# Patient Record
Sex: Female | Born: 1960
Health system: Southern US, Community
[De-identification: ages and names within clinical notes are randomized; demographics above are authoritative.]

## PROBLEM LIST (undated history)

## (undated) DIAGNOSIS — Z1379 Encounter for other screening for genetic and chromosomal anomalies: Secondary | ICD-10-CM

## (undated) DIAGNOSIS — I2699 Other pulmonary embolism without acute cor pulmonale: Secondary | ICD-10-CM

## (undated) DIAGNOSIS — Z809 Family history of malignant neoplasm, unspecified: Secondary | ICD-10-CM

## (undated) DIAGNOSIS — N809 Endometriosis, unspecified: Secondary | ICD-10-CM

## (undated) DIAGNOSIS — D259 Leiomyoma of uterus, unspecified: Secondary | ICD-10-CM

## (undated) DIAGNOSIS — L120 Bullous pemphigoid: Secondary | ICD-10-CM

## (undated) DIAGNOSIS — I82409 Acute embolism and thrombosis of unspecified deep veins of unspecified lower extremity: Secondary | ICD-10-CM

## (undated) DIAGNOSIS — I82412 Acute embolism and thrombosis of left femoral vein: Secondary | ICD-10-CM

## (undated) HISTORY — PX: ROTATOR CUFF REPAIR: SHX139

## (undated) HISTORY — DX: Acute embolism and thrombosis of left femoral vein: I82.412

## (undated) HISTORY — PX: COLONOSCOPY: SHX174

## (undated) HISTORY — DX: Acute embolism and thrombosis of unspecified deep veins of unspecified lower extremity: I82.409

## (undated) HISTORY — DX: Other pulmonary embolism without acute cor pulmonale: I26.99

## (undated) HISTORY — DX: Endometriosis, unspecified: N80.9

## (undated) HISTORY — DX: Encounter for other screening for genetic and chromosomal anomalies: Z13.79

## (undated) HISTORY — DX: Leiomyoma of uterus, unspecified: D25.9

## (undated) HISTORY — PX: TUBAL LIGATION: SHX77

## (undated) HISTORY — DX: Family history of malignant neoplasm, unspecified: Z80.9

## (undated) HISTORY — PX: THUMB ARTHROSCOPY: SHX2509

---

## 2005-07-25 HISTORY — PX: LAPAROSCOPIC SUPRACERVICAL HYSTERECTOMY: SUR797

## 2005-11-22 ENCOUNTER — Other Ambulatory Visit: Admission: RE | Admit: 2005-11-22 | Discharge: 2005-11-22 | Payer: Self-pay | Admitting: Gynecology

## 2006-05-02 ENCOUNTER — Ambulatory Visit (HOSPITAL_COMMUNITY): Admission: RE | Admit: 2006-05-02 | Discharge: 2006-05-03 | Payer: Self-pay | Admitting: Gynecology

## 2006-05-02 ENCOUNTER — Encounter (INDEPENDENT_AMBULATORY_CARE_PROVIDER_SITE_OTHER): Payer: Self-pay | Admitting: *Deleted

## 2006-07-25 DIAGNOSIS — N809 Endometriosis, unspecified: Secondary | ICD-10-CM

## 2006-07-25 DIAGNOSIS — N80129 Deep endometriosis of ovary, unspecified ovary: Secondary | ICD-10-CM

## 2006-07-25 HISTORY — DX: Deep endometriosis of ovary, unspecified ovary: N80.129

## 2006-07-25 HISTORY — DX: Endometriosis, unspecified: N80.9

## 2007-05-08 ENCOUNTER — Other Ambulatory Visit: Admission: RE | Admit: 2007-05-08 | Discharge: 2007-05-08 | Payer: Self-pay | Admitting: Gynecology

## 2007-05-11 ENCOUNTER — Encounter: Admission: RE | Admit: 2007-05-11 | Discharge: 2007-05-11 | Payer: Self-pay | Admitting: Gynecology

## 2007-05-25 ENCOUNTER — Encounter: Admission: RE | Admit: 2007-05-25 | Discharge: 2007-05-25 | Payer: Self-pay | Admitting: Gynecology

## 2008-11-11 ENCOUNTER — Other Ambulatory Visit: Admission: RE | Admit: 2008-11-11 | Discharge: 2008-11-11 | Payer: Self-pay | Admitting: Gynecology

## 2008-11-11 ENCOUNTER — Ambulatory Visit: Payer: Self-pay | Admitting: Gynecology

## 2008-11-11 ENCOUNTER — Encounter: Payer: Self-pay | Admitting: Gynecology

## 2008-11-13 ENCOUNTER — Encounter: Admission: RE | Admit: 2008-11-13 | Discharge: 2008-11-13 | Payer: Self-pay | Admitting: Gynecology

## 2010-04-08 ENCOUNTER — Ambulatory Visit: Payer: Self-pay | Admitting: Gynecology

## 2010-04-08 ENCOUNTER — Other Ambulatory Visit: Admission: RE | Admit: 2010-04-08 | Discharge: 2010-04-08 | Payer: Self-pay | Admitting: Gynecology

## 2010-12-10 NOTE — Op Note (Signed)
NAMECONNOR, FOXWORTHY               ACCOUNT NO.:  [id number]   MEDICAL RECORD NO.:  [id number]          PATIENT TYPE:  OIB   LOCATION:  9310                          FACILITY:  [facility]   PHYSICIAN:  Timothy P. Fontaine, M.D.DATE OF BIRTH:  November 13, 1960   DATE OF PROCEDURE:  05/02/2006  DATE OF DISCHARGE:                                 OPERATIVE REPORT   PREOPERATIVE DIAGNOSES:  Menorrhagia, dysmenorrhea, leiomyomata.   POSTOPERATIVE DIAGNOSES:  Menorrhagia, dysmenorrhea, leiomyomata, rule out  endometriosis, posterior cul-de-sac adhesions.   PROCEDURE:  Laparoscopic supracervical hysterectomy and lysis of adhesions.   SURGEON:  Timothy P. Fontaine, M.D.   ASSISTANT:  Rande Brunt. Eda Paschal, M.D.   ANESTHETIC:  General endotracheal.   ESTIMATED BLOOD LOSS:  Approximately 200 mL.   COMPLICATIONS:  None.   SPECIMEN:  Uterus morcellated.   FINDINGS:  Uterus enlarged with a large myoma, right and left fallopian  tubes grossly normal, evidence of prior tubal sterilization.  Right and left  ovaries grossly normal, free and mobile.  Anterior cul-de-sac grossly  normal, posterior cul-de-sac with filmy adhesions between sigmoid and  posterior peritoneal surface.  Powder burn type lesion posterior surface  consistent with endometriosis removed with specimen.  Upper abdominal exam  grossly normal.   PROCEDURE:  The patient was taken to the operating room, underwent general  anesthesia was placed low dorsal lithotomy position, received abdominal  perineal vaginal preparation with Betadine solution per nursing personnel.  EUA performed.  Bladder emptied with indwelling Foley catheterization placed  in sterile technique.  Cervix was visualized, grasped with single-tooth  tenaculum and the RUMI uterine manipulator was placed, the 8 cm probe used.  The patient was then draped in usual fashion.  A transverse infraumbilical  incision was made.  Using the OptiVu type direct entry system the abdomen  was directly entered under laparoscopic visualization, 10 mm port without  difficulty and subsequently insufflated.  Right and left suprapubic 5-mm  ports were then placed under direct visualization after transillumination  for the vessels without difficulty.  Examination of pelvic organs, upper  abdominal exam was then carried out with findings noted above.  Attempted  initial manipulation revealed difficulty in manipulating the uterus due to  the uterine size and a large myoma and subsequently a 5 mm midline  suprapubic port was placed to aid in manipulation.  Using the single tooth 5  mm laparoscopic manipulator and the intrauterine RUMI manipulator, the  uterus was adequately able to be manipulated.  The right round ligament was  identified, bipolar cauterized using the Gyrus tripolar incisor and sharply  incised.  The right uterine ovarian pedicle was tripolar cauterized x2  passes and sharply incised.  The right parametrial tissues were then sharply  incised after bipolar cauterization without difficulty and the anterior  vesicouterine peritoneal fold was then sharply incised with the initial  development of the bladder flap.  A similar procedure was then carried out  on the other side with meeting in the middle of the anterior cul-de-sac  peritoneal incision.  The left uterine vessels were then skeletonized and  identified and were  bipolar cauterized x3 passes and subsequently sharply  incised.  A similar procedure was carried out on the other side.  Using the  gyrus spatula, the cervix was then transected to the level of the RUMI  manipulator which ultimately was then removed and the uterus was then  amputated from the cervical stump.  A few bleeding sites were then bipolar  cauterized with the tripolar.  The left 5-mm suprapubic port was then  removed.  The skin incisions slightly enlarged and the morcellating trocar  was then placed under direct visualization into the peritoneal  cavity  without difficulty.  The uterus was then progressively morcellated and  ultimately removed in its entirety through multiple passes.  The weight of  the specimen was determined at 450 grams.  Attention was directed to the  abdomen and cul-de-sac to meticulously remove all fragments.  The pelvis was  copiously irrigated and again any remaining fragments were removed.  Pelvis  was inspected, all pedicle sites inspected and adequate hemostasis was  visualized and the endocervical canal was entered with the bipolar  instrument and the cervical stump was cauterized in the endocervical canal.  Using the flat portion of the gyrus spatula the canal was also cauterized in  a circumferential manner.  Intercede was then placed over the cervical area,  the 5 mm ports were then removed and using the gyrus port fascial closing  device the morcellating left suprapubic port was then closed using 0 Vicryl  suture without difficulty.  The gas was slowly allowed to escape.  All cul-  de-sac right and left ovarian pedicles and the anterior port sites were all  visualized under a low-pressure situation showing adequate hemostasis and  the infraumbilical port was then backed out under direct visualization  showing no evidence of hernia formation.  Adequate hemostasis.  A 0 Vicryl  interrupted subcutaneous fascial stitch was placed infraumbilically and all  skin incisions were closed using Dermabond skin adhesive.  The patient was  placed in supine position, awakened without difficulty and was taken to the  recovery room in good condition having tolerated procedure well having free-  flowing clear yellow urine noted.      Timothy P. Fontaine, M.D.  Electronically Signed     TPF/MEDQ  D:  05/02/2006  T:  05/04/2006  Job:  [id number]

## 2010-12-10 NOTE — Discharge Summary (Signed)
[facility], [facility]               ACCOUNT NO.:  [id number]   MEDICAL RECORD NO.:  [id number]          PATIENT TYPE:  OIB   LOCATION:  9310                          FACILITY:  [facility]   PHYSICIAN:  Timothy P. Fontaine, M.D.DATE OF BIRTH:  March 03, 1961   DATE OF ADMISSION:  05/02/2006  DATE OF DISCHARGE:  05/03/2006                                 DISCHARGE SUMMARY   DISCHARGE DIAGNOSES:  1. Menorrhagia.  2. Dysmenorrhea.  3. Leiomyomata.   PROCEDURE:  Laparoscopic supracervical hysterectomy with lysis of adhesions  on May 02, 2006, Dr. Colin Broach, pathology pending.   HOSPITAL COURSE:  A 50 year old, G0 with increasing menorrhagia and  dysmenorrhea with leiomyomata underwent uncomplicated laparoscopic  supracervical hysterectomy with lysis of adhesions on May 02, 2006.  The  patient's postoperative course was uncomplicated.  She was discharged on  postop day #1, ambulating well, tolerating a regular diet, voiding without  difficulty with a postoperative hemoglobin of 10.9.  The patient received  precautions, instructions and followup.  She will be seen in the office in 2  weeks following discharge.   DISCHARGE MEDICATIONS:  Received a prescription for Darvocet-N 100, #20, one  to two p.o. q.4-6 h. p.r.n. pain.      Timothy P. Fontaine, M.D.  Electronically Signed     TPF/MEDQ  D:  05/03/2006  T:  05/04/2006  Job:  [id number]

## 2010-12-10 NOTE — H&P (Signed)
Julie Harding, Julie Harding               ACCOUNT NO.:  [id number]   MEDICAL RECORD NO.:  [id number]          PATIENT TYPE:  AMB   LOCATION:  SDC                           [facility]ILITY:  [facility]   PHYSICIAN:  Timothy P. Fontaine, M.D.DATE OF BIRTH:  01-26-1961   DATE OF ADMISSION:  DATE OF DISCHARGE:                                HISTORY & PHYSICAL   Does not have a hospital number yet.  She is being admitted to [facility] for surgery October 9, 7:30.   CHIEF COMPLAINT:  Menorrhagia, dysmenorrhea, leiomyomata.   HISTORY OF PRESENT ILLNESS:  A 50 year old G0, status post tubal  sterilization, presents with increasing menorrhagia, dysmenorrhea, lower  abdominal discomfort.  Outpatient evaluation revealed a large 12-week size  uterus with ultrasound confirming a myoma.  The patient is admitted at this  time for attempt laparoscopic supracervical hysterectomy, possible total  abdominal hysterectomy, bilateral salpingo-oophorectomy.   PAST MEDICAL HISTORY:  Unremarkable.   PAST SURGICAL HISTORY:  Includes a tubal sterilization, orthopedic repair.   CURRENT MEDICATIONS:  None.   ALLERGIES:  VICODIN.   REVIEW OF SYSTEMS:  Noncontributory.   FAMILY HISTORY:  Noncontributory.   SOCIAL HISTORY:  Noncontributory.   ADMISSION PHYSICAL EXAM:  Afebrile.  Vital signs are stable.  HEENT: Normal.  LUNGS: Clear.  CARDIAC:  Regular rate without rubs, murmurs or gallops.  ABDOMEN:  Exam benign with palpable uterus above the symphysis.  PELVIC:  External BUS, vagina normal.  Cervix normal.  Uterus 12 weeks size,  bulky, midline, mobile.  Adnexa without gross masses or tenderness.   ASSESSMENT/PLAN:  A 50 year old G0, status post tubal sterilization,  enlarged uterus with leiomyomata, increasing menorrhagia, dysmenorrhea,  pelvic pressure, discomfort for laparoscopic supracervical hysterectomy,  possible total abdominal hysterectomy.  I reviewed the situation with the  patient and options to  include conservative nonsurgical, as well as surgical  intervention.  Options for surgical intervention to include total abdominal  hysterectomy, laparoscopic total hysterectomy, laparoscopic supracervical  hysterectomy were all reviewed with her, and she is comfortable with the  laparoscopic supracervical approach.  The patient understands that she may  continue to have some bleeding following the procedure if endometrial glands  are found within the cervical stump, and she understands and accepts this  possibility.  Long-term management issues up to and including trachelectomy  was also reviewed with her, and she understands and accepts this  possibility.  The patient also understands, due to the bulk of her uterus,  as well as on history, she is  noted during the time of her tubal the doctor  had noted that there were some adhesions, that if indeed that we are unable  to proceed with the laparoscopic approach.  I feel that it is unsafe to  proceed with this, that we would convert to an abdominal hysterectomy and at  that point would remove her cervix, and she understands and accepts that.  Ovarian conservation issue was reviewed with her, and the options of keeping  both of her ovaries versus having them removed was reviewed.  The issues of  continued hormone production versus  problems with her ovaries in the future  requiring re-operation or ovarian cancer in the future was reviewed, and she  prefers to keep both ovaries, accepting the risk of ovarian cancer or  problems with her ovaries in the future, although she does give me  permission to remove one or both ovaries, if significant pathology is  encountered at the time of surgery.  Sexuality following hysterectomy was  also reviewed.  The potential for orgasmic dysfunction and persistent  dyspareunia following the procedure was discussed, understood and accepted.  The patient clearly understands hysterectomy is absolute irreversible   sterility, and she understands and accepts this also.  The acute  intraoperative postoperative risks were reviewed to include the risk of  infection requiring prolonged antibiotics, abscess formation or hematoma  formation requiring reoperation, drainage of abscess or hematomas, the risk  of hemorrhage necessitating transfusion and risks of transfusion including  transfusion reaction, hepatitis, HIV, mad cow disease and other unknown  entities.  The risks of wound complications, either from the smaller  laparoscopic incisions or larger abdominal hysterectomy incisions were  discussed, and the potential for opening and draining of incisions closure  by secondary intention was discussed, understood and accepted.  The risk of  an inadvertent injury to internal organs including bowel, bladder, ureters,  vessels and nerves necessitating major exploratory reparative surgeries and  future reparative surgeries including ostomy formation, either immediately  recognized or delay recognized was discussed, understood and accepted.  Again, if we achieve the laparoscopic supracervical approach, she  understands that the continued Pap smear surveillance is required and the  potential for persistent bleeding following the procedure is possible, and  she understands and accepts this.  The patient's questions were answered to  her satisfaction, and she is ready to proceed with surgery.      Timothy P. Fontaine, M.D.  Electronically Signed     TPF/MEDQ  D:  04/19/2006  T:  04/20/2006  Job:  [id number]

## 2013-07-25 DIAGNOSIS — I82409 Acute embolism and thrombosis of unspecified deep veins of unspecified lower extremity: Secondary | ICD-10-CM

## 2013-07-25 HISTORY — DX: Acute embolism and thrombosis of unspecified deep veins of unspecified lower extremity: I82.409

## 2014-02-13 ENCOUNTER — Ambulatory Visit: Payer: Self-pay | Admitting: Podiatry

## 2014-02-27 ENCOUNTER — Encounter: Payer: Self-pay | Admitting: Podiatry

## 2014-02-27 ENCOUNTER — Ambulatory Visit (INDEPENDENT_AMBULATORY_CARE_PROVIDER_SITE_OTHER): Payer: 59 | Admitting: Podiatry

## 2014-02-27 VITALS — BP 104/59 | HR 53 | Resp 16 | Ht 62.0 in | Wt 123.0 lb

## 2014-02-27 DIAGNOSIS — L600 Ingrowing nail: Secondary | ICD-10-CM

## 2014-02-27 NOTE — Progress Notes (Signed)
Subjective:     Patient ID: Julie Harding, female   DOB: Aug 24, 1960, 53 y.o.   MRN: [id number]  HPI patient states that her right second nail has been giving her trouble for at least 5 years not growing right and becoming increasingly discomforting. States she runs approximately 50 miles a week   Review of Systems  All other systems reviewed and are negative.      Objective:   Physical Exam  Nursing note and vitals reviewed. Constitutional: She is oriented to person, place, and time.  Cardiovascular: Intact distal pulses.   Musculoskeletal: Normal range of motion.  Neurological: She is oriented to person, place, and time.  Skin: Skin is warm.   neurovascular status intact with muscle strength adequate and range of motion of the subtalar and midtarsal joint within normal limits. Patient is found to have a damaged second nail right that is growing abnormally and is painful when I palpated from a dorsal direction. Patient's digits are well-perfused and no other pathology was noted     Assessment:     Damaged right second nail secondary to excessive activity and probable initial trauma    Plan:     H&P and condition reviewed with patient. I've recommended removal and a permanent way for this nailbed and explained that it would not regrow. Patient wants surgery and today I infiltrated 60 mg Xylocaine Marcaine mixture applied sterile prep to the area and then removed the nail completely. I exposed matrix and applied phenol 3 applications 30 seconds followed by alcohol lavaged and sterile dressing. Instructed on soaks and reappoint

## 2014-02-27 NOTE — Patient Instructions (Signed)

## 2014-02-27 NOTE — Progress Notes (Signed)
   Subjective:    Patient ID: Julie Harding, female    DOB: [birth year], 53 y.o.   MRN: [id number]  HPI Comments: "I have this bad toe"  Patient c/o tender 2nd toenail for several months, off and on. The nail was initially dark and fell off. Now, the nail is thick and loose from the nail bed. She is an active runner. She keeps the nail trimmed down.     Review of Systems  Cardiovascular:       Calf pain with walking   Gastrointestinal: Positive for blood in stool.  All other systems reviewed and are negative.      Objective:   Physical Exam        Assessment & Plan:

## 2014-03-07 ENCOUNTER — Telehealth: Payer: Self-pay | Admitting: *Deleted

## 2014-03-07 NOTE — Telephone Encounter (Signed)
Per Dr. Paulla Dolly, I called and informed her to stop the Betadine soaks.  Start using Epsom salt soaks instead.  If it's not better by Monday, call and schedule an appointment with Dr. Paulla Dolly.  Call if you have any further questions are concerns.

## 2014-03-07 NOTE — Telephone Encounter (Signed)
I saw him a week ago yesterday.  He removed a toenail on my second toe.  It is still really sore excruciatingly and my toe and entire foot is swollen.  Still doing Betadine soaks and following all his instructions.  Should this be like this?  Please give me a call to put my fear to rest.

## 2014-03-10 ENCOUNTER — Telehealth: Payer: Self-pay | Admitting: *Deleted

## 2014-03-10 NOTE — Telephone Encounter (Signed)
He removed a toenail Friday a week ago.  I called Friday and you called me back.  I been soaking over the weekend in [facility], the swelling went down but it's still ugly and it's still painful.  Is that normal.  I can snap a picture and email it to you so you can let me know if it is normal.  Please call me back.

## 2014-03-12 NOTE — Telephone Encounter (Signed)
I attempted to return her call.  I left a message for her to call me tomorrow.

## 2014-03-19 NOTE — Telephone Encounter (Signed)
I attempted to call the patient again.  I left her a message stating I was just following up from last week.  I hope everything is going well.  Please give [facility] a call if you have any further questions or concerns.

## 2016-06-08 ENCOUNTER — Other Ambulatory Visit: Payer: Self-pay | Admitting: Cardiology

## 2016-06-08 DIAGNOSIS — R0602 Shortness of breath: Secondary | ICD-10-CM

## 2016-06-08 DIAGNOSIS — Z86718 Personal history of other venous thrombosis and embolism: Secondary | ICD-10-CM

## 2016-06-20 ENCOUNTER — Encounter (HOSPITAL_COMMUNITY)
Admission: RE | Admit: 2016-06-20 | Discharge: 2016-06-20 | Disposition: A | Discharge status: Home / Self Care | Payer: Commercial Managed Care - HMO | Source: Ambulatory Visit | Attending: Cardiology | Admitting: Cardiology

## 2016-06-20 ENCOUNTER — Encounter (HOSPITAL_COMMUNITY): Payer: Self-pay

## 2016-06-20 ENCOUNTER — Ambulatory Visit (HOSPITAL_COMMUNITY)
Admission: RE | Admit: 2016-06-20 | Discharge: 2016-06-20 | Disposition: A | Discharge status: Home / Self Care | Payer: Commercial Managed Care - HMO | Source: Ambulatory Visit | Attending: Cardiology | Admitting: Cardiology

## 2016-06-20 DIAGNOSIS — R0602 Shortness of breath: Secondary | ICD-10-CM | POA: Insufficient documentation

## 2016-06-20 DIAGNOSIS — R938 Abnormal findings on diagnostic imaging of other specified body structures: Secondary | ICD-10-CM | POA: Insufficient documentation

## 2016-06-20 DIAGNOSIS — Z86718 Personal history of other venous thrombosis and embolism: Secondary | ICD-10-CM

## 2016-06-20 MED ORDER — TECHNETIUM TO 99M ALBUMIN AGGREGATED
4.0000 | Freq: Once | INTRAVENOUS | Status: AC | PRN
  Administered 2016-06-20: 4 via INTRAVENOUS

## 2016-06-20 MED ORDER — TECHNETIUM TC 99M DIETHYLENETRIAME-PENTAACETIC ACID: 30.0000 | Freq: Once | INTRAVENOUS | Status: DC | PRN

## 2016-07-27 ENCOUNTER — Other Ambulatory Visit: Payer: Self-pay | Admitting: Gynecology

## 2016-07-27 ENCOUNTER — Encounter: Payer: Self-pay | Admitting: Gynecology

## 2016-07-27 ENCOUNTER — Ambulatory Visit (INDEPENDENT_AMBULATORY_CARE_PROVIDER_SITE_OTHER): Payer: Commercial Managed Care - HMO | Admitting: Gynecology

## 2016-07-27 VITALS — BP 120/70 | Ht 63.0 in | Wt 135.0 lb

## 2016-07-27 DIAGNOSIS — Z01419 Encounter for gynecological examination (general) (routine) without abnormal findings: Secondary | ICD-10-CM

## 2016-07-27 DIAGNOSIS — N951 Menopausal and female climacteric states: Secondary | ICD-10-CM

## 2016-07-27 DIAGNOSIS — I824Y9 Acute embolism and thrombosis of unspecified deep veins of unspecified proximal lower extremity: Secondary | ICD-10-CM

## 2016-07-27 DIAGNOSIS — Z1231 Encounter for screening mammogram for malignant neoplasm of breast: Secondary | ICD-10-CM

## 2016-07-27 LAB — TSH: TSH: 2.07 m[IU]/L

## 2016-07-27 NOTE — Addendum Note (Signed)
Addended by: Nelva Nay on: 07/27/2016 12:55 PM   Modules accepted: Orders

## 2016-07-27 NOTE — Progress Notes (Signed)
Jul[facility] Hard[facility] 11-24-1960 [id number]        56 y.o.  G0P0000 for annual exam.  Has not been in the office for 5-6 years. History of laparoscopic supracervical hysterectomy for leiomyoma. Over the past year patient has noticed worsening hot flushes and night sweats. Also noticing weight gain and some depression although there is some situational issues going on in her life.  Past medical history,surgical history, problem list, medications, allergies, family history and social history were all reviewed and documented as reviewed in the EPIC chart.  ROS:  Performed with pertinent positives and negatives included in the history, assessment and plan.   Additional significant findings :  None   Exam: Caryn Bee assistant Vitals:   07/27/16 1144  BP: 120/70  Weight: 135 lb (61.2 kg)  Height: 5\' 3"  (1.6 m)   Body mass index is 23.91 kg/m.  General appearance:  Normal affect, orientation and appearance. Skin: Grossly normal HEENT: Without gross lesions.  No cervical or supraclavicular adenopathy. Thyroid normal.  Lungs:  Clear without wheezing, rales or rhonchi Cardiac: RR, without RMG Abdominal:  Soft, nontender, without masses, guarding, rebound, organomegaly or hernia Breasts:  Examined lying and sitting without masses, retractions, discharge or axillary adenopathy. Pelvic:  Ext, BUS, Vagina normal  Cervix normal. Pap smear done  Adnexa without masses or tenderness    Anus and perineum normal   Rectovaginal normal sphincter tone without palpated masses or tenderness.    Assessment/Plan:  56 y.o. G0P0000 female for annual exam.   1. Menopausal symptoms. Classic menopausal symptoms to include hot flushes, night sweats, not feeling as well, difficulty sleeping, some depression. Has history of DVT several years ago treated with anticoagulants for 3 months. Currently taking a low dose aspirin. Reports being "worked up" and they found nothing as etiology. She's very active running 7  mouth daily. No other family members with clotting issues. Recommended checking Leiden factor V now she is to have that drawn. Also check TSH rule out menopausal cause of her symptoms. Options for management to include OTC products which she has tried and are not working, nonhormonal pharmacologic such as Effexor and HRT. The issues of her DVT and use of HRT from a relative contraindication standpoint also reviewed. Clear risk of precipitating recurrence of the DVT discussed versus quality-of-life symptom relief. At this point the patient would prefer low-dose ERT if possible. Will await Leiden factor screen. If negative then low-dose patch for transdermal delivery to help decrease thrombosis issue reviewed. If positive then will rediscuss nonhormonal options. Patient clearly understands the issues and risks. I also reviewed the NAMS 2017 guidelines with benefits to include symptom relief, cardiovascular and bone health if started early potential reviewed. Breast cancer and thrombosis risks discussed. She does have a supracervical hysterectomy and whether to add progesterone was also reviewed. May initiate with patch with intermittent withdrawal with progesterone to see if she does any spotting intermittently. Will rediscuss after lab results return. 2. Mammography 2010. Patient understands she is clearly overdue and agrees to call and schedule. Names and numbers provided. SBE monthly reviewed. 3. Colonoscopy due this coming year and she is going to follow up with Dr. Sarina Ser for this. 4. Pap smear 2011. Pap smear done today. No history of significant abnormal Pap smears. 5. Health maintenance. No routine labs were done as this was all done through her primary physician's office. Follow up for lab results and further discussion of treatment plan   Anastasio Auerbach MD, 12:41 PM  07/27/2016     

## 2016-07-27 NOTE — Patient Instructions (Signed)
Call to Schedule your mammogram  Facilities in [place]: 1)  [facility]. [facility], [address] Phone: [phone] 2)  Dr. Bertrand at [facility]  [address] Phone: [phone]     Mammogram A mammogram is an X-ray test to find changes in a woman's breast. You should get a mammogram if:  You are 54 years of age or older  You have risk factors.   Your doctor recommends that you have one.  BEFORE THE TEST  Do not schedule the test the week before your period, especially if your breasts are sore during this time.  On the day of your mammogram:  Wash your breasts and armpits well. After washing, do not put on any deodorant or talcum powder on until after your test.   Eat and drink as you usually do.   Take your medicines as usual.   If you are diabetic and take insulin, make sure you:   Eat before coming for your test.   Take your insulin as usual.   If you cannot keep your appointment, call before the appointment to cancel. Schedule another appointment.  TEST  You will need to undress from the waist up. You will put on a hospital gown.   Your breast will be put on the mammogram machine, and it will press firmly on your breast with a piece of plastic called a compression paddle. This will make your breast flatter so that the machine can X-ray all parts of your breast.   Both breasts will be X-rayed. Each breast will be X-rayed from above and from the side. An X-ray might need to be taken again if the picture is not good enough.   The mammogram will last about 15 to 30 minutes.  AFTER THE TEST Finding out the results of your test Ask when your test results will be ready. Make sure you get your test results.  Document Released: 10/07/2008 Document Revised: 06/30/2011 Document Reviewed: 10/07/2008 [employer] Patient Information 2012 [employer].   

## 2016-07-28 DIAGNOSIS — M25551 Pain in right hip: Secondary | ICD-10-CM | POA: Diagnosis not present

## 2016-07-28 LAB — PAP IG W/ RFLX HPV ASCU

## 2016-07-29 ENCOUNTER — Ambulatory Visit
Admission: RE | Admit: 2016-07-29 | Discharge: 2016-07-29 | Disposition: A | Discharge status: Home / Self Care | Payer: Commercial Managed Care - HMO | Source: Ambulatory Visit | Attending: Gynecology | Admitting: Gynecology

## 2016-07-29 DIAGNOSIS — Z1231 Encounter for screening mammogram for malignant neoplasm of breast: Secondary | ICD-10-CM | POA: Diagnosis not present

## 2016-08-01 DIAGNOSIS — R0602 Shortness of breath: Secondary | ICD-10-CM | POA: Diagnosis not present

## 2016-08-01 LAB — FACTOR 5 LEIDEN

## 2016-08-04 ENCOUNTER — Telehealth: Payer: Self-pay | Admitting: *Deleted

## 2016-08-04 ENCOUNTER — Other Ambulatory Visit: Payer: Self-pay | Admitting: Gynecology

## 2016-08-04 MED ORDER — ESTRADIOL 0.05 MG/24HR TD PTTW: 1.0000 | MEDICATED_PATCH | TRANSDERMAL | 8 refills | Status: DC

## 2016-08-04 MED ORDER — PROGESTERONE MICRONIZED 100 MG PO CAPS: ORAL_CAPSULE | ORAL | 2 refills | Status: DC

## 2016-08-04 NOTE — Telephone Encounter (Signed)
PA done online for vivelle dot patch 0.05 mg via cover my med, will wait for response.

## 2016-08-09 MED ORDER — ESTRADIOL 0.5 MG PO TABS: 0.5000 mg | ORAL_TABLET | Freq: Every day | ORAL | 11 refills | Status: DC

## 2016-08-09 NOTE — Telephone Encounter (Signed)
Pt informed with the below note, Rx sent. 

## 2016-08-09 NOTE — Telephone Encounter (Signed)
She can take estradiol 0.5 mg orally #30 refill 11

## 2016-08-09 NOTE — Telephone Encounter (Signed)
Pt insurance company denied the coverage for estradiol 0.05 patches, any other options for medication? Please advise

## 2016-08-18 DIAGNOSIS — M25551 Pain in right hip: Secondary | ICD-10-CM | POA: Diagnosis not present

## 2016-08-25 DIAGNOSIS — I2699 Other pulmonary embolism without acute cor pulmonale: Secondary | ICD-10-CM

## 2016-08-25 HISTORY — DX: Other pulmonary embolism without acute cor pulmonale: I26.99

## 2016-08-29 DIAGNOSIS — M7061 Trochanteric bursitis, right hip: Secondary | ICD-10-CM | POA: Diagnosis not present

## 2016-09-02 ENCOUNTER — Telehealth: Payer: Self-pay | Admitting: *Deleted

## 2016-09-02 NOTE — Telephone Encounter (Signed)
Pt understood all the below, her only last question was she takes progesterone day 1-12 every other month and not monthly. Should she skip March and start Rx again in April?

## 2016-09-02 NOTE — Telephone Encounter (Signed)
#  1 are the night sweats since starting Prometrium or did she have them with the estrogen also? #2 weight gain in their early 37s is a very common problem both for men and women due to changes in metabolism and is a balance between calories in and calories out. Sometimes gaining a little weight is difficult to avoid. A recent thyroid check was normal #3 no to the diet pills. I'm not a believer in these for long-term weight control. They do come with potential significant side effects and have never been shown to be of benefit for long-term weight control.

## 2016-09-02 NOTE — Telephone Encounter (Signed)
Pt takes estradiol 0.5 mg tablet daily and progesterone day 1-12 monthly. Has several issues to relay:  1. Pt started the progesterone on feb 1 and c/o  issues with sleep nightly since taking medication, takes at bedtime, waking up sweating in middle of night. Patient has not had any bleeding while on progesterone.  2. Patient said she feels slightly depressed due to weight gain, pt is a active runner, runs 7 miles daily, when she takes her weight she has gained a pound. I did explained to patient along with age metabolism does slow down and she understood, states she eats very heathy.   3. She asked if you would be willing to prescribed medication weight loss medication?   Please advise

## 2016-09-02 NOTE — Telephone Encounter (Signed)
I would go ahead and stop the progesterone now. If the night sweats continue let me know. Otherwise if they resolve I would try with the progesterone one more time the beginning of next month and see what happens. If the sweats again recur and let me know. Let me know if she does any bleeding.

## 2016-09-02 NOTE — Telephone Encounter (Signed)
Pt said night sweats only started with progesterone, pt became very tearful on phone when relaying the information to her regarding weight. Pt asked what should she do regarding the progesterone and lack of sleep and night sweats? Please advise

## 2016-09-05 NOTE — Telephone Encounter (Signed)
Pt informed with the below note, will skip march and start progesterone in April.

## 2016-09-05 NOTE — Telephone Encounter (Signed)
Okay 

## 2016-09-09 ENCOUNTER — Telehealth: Payer: Self-pay | Admitting: *Deleted

## 2016-09-09 NOTE — Telephone Encounter (Signed)
Pt informed, she will try this and call if she needs a new Rx for this dose

## 2016-09-09 NOTE — Telephone Encounter (Signed)
Try increasing her estradiol to 1-1/2 pill daily which will be 0.75 mg equivalent. We'll see if this doesn't help with the hot flushes

## 2016-09-09 NOTE — Telephone Encounter (Signed)
Pt called to follow up from telephone call on 09/02/16 told to call if her hot flashes and night sweats continue, it has been 1 week and still same symptoms.  Please advise

## 2016-09-19 DIAGNOSIS — R0789 Other chest pain: Secondary | ICD-10-CM | POA: Diagnosis not present

## 2016-09-20 ENCOUNTER — Telehealth: Payer: Self-pay | Admitting: *Deleted

## 2016-09-20 DIAGNOSIS — I2699 Other pulmonary embolism without acute cor pulmonale: Secondary | ICD-10-CM | POA: Diagnosis not present

## 2016-09-20 DIAGNOSIS — I2609 Other pulmonary embolism with acute cor pulmonale: Secondary | ICD-10-CM | POA: Diagnosis not present

## 2016-09-20 DIAGNOSIS — I82412 Acute embolism and thrombosis of left femoral vein: Secondary | ICD-10-CM | POA: Diagnosis not present

## 2016-09-20 DIAGNOSIS — I82432 Acute embolism and thrombosis of left popliteal vein: Secondary | ICD-10-CM | POA: Diagnosis not present

## 2016-09-20 DIAGNOSIS — R079 Chest pain, unspecified: Secondary | ICD-10-CM | POA: Diagnosis not present

## 2016-09-20 DIAGNOSIS — R0602 Shortness of breath: Secondary | ICD-10-CM | POA: Diagnosis not present

## 2016-09-20 DIAGNOSIS — I82402 Acute embolism and thrombosis of unspecified deep veins of left lower extremity: Secondary | ICD-10-CM | POA: Diagnosis not present

## 2016-09-20 NOTE — Telephone Encounter (Signed)
#  1 I would follow her primary physician's advice #2 she could try the estradiol 1 mg daily instead of the one and a half

## 2016-09-20 NOTE — Telephone Encounter (Signed)
Pt informed with the below note. 

## 2016-09-20 NOTE — Telephone Encounter (Signed)
Pt called had increased her estradiol 0.5 mg dose to 1-1/2 pill daily on 09/10/16 and on 09/13/16 has noticed breast pain, pain in shoulder, back pain and difficulty taking deep breath, Pt went to urgent care yesterday and the PA told her that her lungs sound fine.  Pt does have PCP she called them as well and was told to go to ER due to symptoms for imaging. Pt said her hot flashes and night sweats have stopped. I advised pt to do as PCP recommend, I told pt I would like you know about this and how to proceed with estradiol. Please advise

## 2016-09-21 ENCOUNTER — Encounter: Payer: Self-pay | Admitting: Gynecology

## 2016-09-21 NOTE — Telephone Encounter (Signed)
Dr.Fontaine patient called today for update. Went to ER at [facility] diagnosed with pulmonary embolisms and DVT was admitted to hospital thinking she may be getting discharged later today. Pt asked if estradiol should be discontinued? And what's the next step regarding hot flashes. Please advise

## 2016-09-21 NOTE — Telephone Encounter (Signed)
#  1 this is a low dose to start with #2 difficult to separate affects of medication versus effects of losing her parents which may attribute to those kind of feelings.  I would suggest giving it a try to see how she does. There are very few other choices for nonhormonal treatment of hot flushes

## 2016-09-21 NOTE — Telephone Encounter (Addendum)
Yes, both her estradiol and Prometrium need to be discontinued and she should never restart them. I would recommend a trial of Effexor XR 37.5. This is an antidepressant that has been shown to help with hot flushes and is nonhormonal. I would start this for 2 weeks and then call me and see how she's doing. We can then increase her to the 75 mg dose if needed. If she would start to feel more anxious/depressed after starting the medication that I need to know this. Prescribed a 30 day course of the 37.5 with 1 refill.

## 2016-09-21 NOTE — Telephone Encounter (Signed)
Patient advised. Patient said that after her parents died awhile back she tried Effexor and did not like it.She said it made her feel "like a slug" stating she did not want to get out of bed or get off couch while on it.

## 2016-09-26 NOTE — Telephone Encounter (Signed)
Patient declines going on Effexor at this time. She said hotflashes have not returned. She said since bring placed on blood thinners in the hospital that she is very cold. She will call and let us know if she changes her mind and wants to try Rx.

## 2016-10-04 DIAGNOSIS — R0602 Shortness of breath: Secondary | ICD-10-CM | POA: Diagnosis not present

## 2016-10-04 DIAGNOSIS — Z86711 Personal history of pulmonary embolism: Secondary | ICD-10-CM | POA: Diagnosis not present

## 2016-10-04 DIAGNOSIS — Z86718 Personal history of other venous thrombosis and embolism: Secondary | ICD-10-CM | POA: Diagnosis not present

## 2016-10-21 DIAGNOSIS — I2699 Other pulmonary embolism without acute cor pulmonale: Secondary | ICD-10-CM | POA: Diagnosis not present

## 2016-10-21 DIAGNOSIS — R0600 Dyspnea, unspecified: Secondary | ICD-10-CM | POA: Diagnosis not present

## 2016-10-21 DIAGNOSIS — R0789 Other chest pain: Secondary | ICD-10-CM | POA: Diagnosis not present

## 2016-12-06 DIAGNOSIS — M25562 Pain in left knee: Secondary | ICD-10-CM | POA: Diagnosis not present

## 2016-12-06 DIAGNOSIS — M25572 Pain in left ankle and joints of left foot: Secondary | ICD-10-CM | POA: Diagnosis not present

## 2016-12-28 ENCOUNTER — Other Ambulatory Visit: Payer: Self-pay | Admitting: Internal Medicine

## 2016-12-28 ENCOUNTER — Ambulatory Visit
Admission: RE | Admit: 2016-12-28 | Discharge: 2016-12-28 | Disposition: A | Discharge status: Home / Self Care | Payer: Commercial Managed Care - HMO | Source: Ambulatory Visit | Attending: Internal Medicine | Admitting: Internal Medicine

## 2016-12-28 DIAGNOSIS — S8991XA Unspecified injury of right lower leg, initial encounter: Secondary | ICD-10-CM | POA: Diagnosis not present

## 2016-12-28 DIAGNOSIS — M79605 Pain in left leg: Secondary | ICD-10-CM | POA: Diagnosis not present

## 2016-12-28 DIAGNOSIS — R609 Edema, unspecified: Secondary | ICD-10-CM

## 2016-12-28 DIAGNOSIS — M79604 Pain in right leg: Secondary | ICD-10-CM

## 2016-12-28 DIAGNOSIS — M79661 Pain in right lower leg: Secondary | ICD-10-CM | POA: Diagnosis not present

## 2017-01-03 DIAGNOSIS — R0602 Shortness of breath: Secondary | ICD-10-CM | POA: Diagnosis not present

## 2017-01-03 DIAGNOSIS — Z86711 Personal history of pulmonary embolism: Secondary | ICD-10-CM | POA: Diagnosis not present

## 2017-01-11 ENCOUNTER — Other Ambulatory Visit: Payer: Self-pay | Admitting: Cardiology

## 2017-01-11 DIAGNOSIS — Z86718 Personal history of other venous thrombosis and embolism: Secondary | ICD-10-CM | POA: Diagnosis not present

## 2017-01-11 DIAGNOSIS — Z86711 Personal history of pulmonary embolism: Secondary | ICD-10-CM | POA: Diagnosis not present

## 2017-01-11 DIAGNOSIS — R0602 Shortness of breath: Secondary | ICD-10-CM

## 2017-01-16 ENCOUNTER — Ambulatory Visit (HOSPITAL_COMMUNITY)
Admission: RE | Admit: 2017-01-16 | Discharge: 2017-01-16 | Disposition: A | Discharge status: Home / Self Care | Payer: Commercial Managed Care - HMO | Source: Ambulatory Visit | Attending: Cardiology | Admitting: Cardiology

## 2017-01-16 ENCOUNTER — Encounter (HOSPITAL_COMMUNITY)
Admission: RE | Admit: 2017-01-16 | Discharge: 2017-01-16 | Disposition: A | Discharge status: Home / Self Care | Payer: Commercial Managed Care - HMO | Source: Ambulatory Visit | Attending: Cardiology | Admitting: Cardiology

## 2017-01-16 DIAGNOSIS — R0602 Shortness of breath: Secondary | ICD-10-CM | POA: Insufficient documentation

## 2017-01-16 DIAGNOSIS — Z86711 Personal history of pulmonary embolism: Secondary | ICD-10-CM

## 2017-01-16 MED ORDER — TECHNETIUM TC 99M DIETHYLENETRIAME-PENTAACETIC ACID
30.0000 | Freq: Once | INTRAVENOUS | Status: AC | PRN
  Administered 2017-01-16: 30 via RESPIRATORY_TRACT

## 2017-01-16 MED ORDER — TECHNETIUM TO 99M ALBUMIN AGGREGATED
4.0000 | Freq: Once | INTRAVENOUS | Status: AC
  Administered 2017-01-16: 4 via INTRAVENOUS

## 2017-01-31 DIAGNOSIS — M545 Low back pain: Secondary | ICD-10-CM | POA: Diagnosis not present

## 2017-01-31 DIAGNOSIS — M25571 Pain in right ankle and joints of right foot: Secondary | ICD-10-CM | POA: Diagnosis not present

## 2017-01-31 DIAGNOSIS — M25561 Pain in right knee: Secondary | ICD-10-CM | POA: Diagnosis not present

## 2017-01-31 DIAGNOSIS — M25562 Pain in left knee: Secondary | ICD-10-CM | POA: Diagnosis not present

## 2017-01-31 DIAGNOSIS — M25551 Pain in right hip: Secondary | ICD-10-CM | POA: Diagnosis not present

## 2017-02-06 DIAGNOSIS — M25561 Pain in right knee: Secondary | ICD-10-CM | POA: Diagnosis not present

## 2017-02-10 DIAGNOSIS — M25561 Pain in right knee: Secondary | ICD-10-CM | POA: Diagnosis not present

## 2017-03-03 ENCOUNTER — Institutional Professional Consult (permissible substitution): Payer: Commercial Managed Care - HMO | Admitting: Internal Medicine

## 2017-03-10 DIAGNOSIS — M25561 Pain in right knee: Secondary | ICD-10-CM | POA: Diagnosis not present

## 2017-04-17 ENCOUNTER — Encounter: Payer: Self-pay | Admitting: Pulmonary Disease

## 2017-04-17 ENCOUNTER — Other Ambulatory Visit (INDEPENDENT_AMBULATORY_CARE_PROVIDER_SITE_OTHER): Payer: 59

## 2017-04-17 ENCOUNTER — Ambulatory Visit (INDEPENDENT_AMBULATORY_CARE_PROVIDER_SITE_OTHER): Payer: 59 | Admitting: Pulmonary Disease

## 2017-04-17 VITALS — BP 112/68 | HR 74 | Ht 62.0 in | Wt 133.0 lb

## 2017-04-17 DIAGNOSIS — R531 Weakness: Secondary | ICD-10-CM

## 2017-04-17 DIAGNOSIS — R0600 Dyspnea, unspecified: Secondary | ICD-10-CM

## 2017-04-17 DIAGNOSIS — Z86711 Personal history of pulmonary embolism: Secondary | ICD-10-CM | POA: Diagnosis not present

## 2017-04-17 DIAGNOSIS — I82412 Acute embolism and thrombosis of left femoral vein: Secondary | ICD-10-CM

## 2017-04-17 LAB — CBC WITH DIFFERENTIAL/PLATELET
BASOS ABS: 0 10*3/uL (ref 0.0–0.1)
BASOS PCT: 0.6 % (ref 0.0–3.0)
EOS PCT: 3.8 % (ref 0.0–5.0)
Eosinophils Absolute: 0.3 10*3/uL (ref 0.0–0.7)
HEMATOCRIT: 44.4 % (ref 36.0–46.0)
Hemoglobin: 14.8 g/dL (ref 12.0–15.0)
LYMPHS ABS: 2.4 10*3/uL (ref 0.7–4.0)
LYMPHS PCT: 35.1 % (ref 12.0–46.0)
MCHC: 33.3 g/dL (ref 30.0–36.0)
MCV: 96.4 fl (ref 78.0–100.0)
MONOS PCT: 8.6 % (ref 3.0–12.0)
Monocytes Absolute: 0.6 10*3/uL (ref 0.1–1.0)
NEUTROS ABS: 3.5 10*3/uL (ref 1.4–7.7)
Neutrophils Relative %: 51.9 % (ref 43.0–77.0)
PLATELETS: 296 10*3/uL (ref 150.0–400.0)
RBC: 4.61 Mil/uL (ref 3.87–5.11)
RDW: 12.4 % (ref 11.5–15.5)
WBC: 6.8 10*3/uL (ref 4.0–10.5)

## 2017-04-17 MED ORDER — RIVAROXABAN (XARELTO) VTE STARTER PACK (15 & 20 MG): ORAL_TABLET | ORAL | 0 refills | Status: DC

## 2017-04-17 NOTE — Patient Instructions (Signed)
For all the joint and muscle aches: I think this is due to Eliquis Stop Eliquis, start Xarelto, discuss this with your pharmacist  For shortness of breath: We will check a lung function tests We will check a 6 minute walk test where we measure your oxygen level We will check a complete blood count to make sure you are not anemic  For the history of recurrent DVT/pulmonary embolism: He will need lifelong anticoagulation, we will use Xarelto In the future we can decrease the dose of Xarelto but for now I when she to remain on normal dosing  F/u in 5-6 weeks

## 2017-04-17 NOTE — Progress Notes (Signed)
Subjective:    Patient ID: Julie Harding, female    DOB: [birth year], 56 y.o.   MRN: [id number]  Synopsis: referred in 2018 for evaluation of PE.   HPI Chief Complaint  Patient presents with  . Advice Only    Referred by Dr. Einar Gip for hx of PE.      [name] was referred for evaluation of a DVT/PE. > first occurred in 2015 for now goo reason > had a work up for hypercoagulability that was negative > She started on estrogen therapy in 08/2016 for menopausal symptoms > 09/13/2016 she developed dyspnea that progressed significantly over the next wek > the dyspnea was associated with R shoulder and chest pain as well with a dry cough > she went to the ER for further evaluation.  > since then she says that she has been "crappy" > she dislikes Eliquis because she says her knees and muscles hurt > she has gained 12 pounds since the last visit.  > she conitnues to struggle with dyspnea.  She continues to exercise  She has a cough: > dry, difficult mucus production > has been more of problem in the last few weeks  She has a lot of second hand smoke exposure as a child.  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    Past Medical History:  Diagnosis Date  . DVT (deep venous thrombosis) (HCC)    Right leg  . Pulmonary embolus ([place]) 08/2016     Family History  Problem Relation Age of Onset  . COPD Mother   . Cancer Father        Throat,Prostate  . Heart disease Brother      Social History   Social History  . Marital status: Married    Spouse name: N/A  . Number of children: N/A  . Years of education: N/A   Occupational History  . Not on file.   Social History Main Topics  . Smoking status: Never Smoker  . Smokeless tobacco: Never Used  . Alcohol use 4.8 oz/week    8 Standard drinks or equivalent per week  . Drug use: No  . Sexual activity: Yes    Birth control/ protection: Surgical     Comment: 1st  intercourse 56 yo-fewer than 5 partners-HYST   Other Topics Concern  . Not on file   Social History Narrative  . No narrative on file     Allergies  Allergen Reactions  . Vicodin [Hydrocodone-Acetaminophen] Nausea And Vomiting     Outpatient Medications Prior to Visit  Medication Sig Dispense Refill  . Multiple Vitamin (MULTIVITAMIN) tablet Take 1 tablet by mouth daily.    Marland Kitchen aspirin EC 81 MG tablet Take 81 mg by mouth daily.    Marland Kitchen estradiol (ESTRACE) 0.5 MG tablet Take 1 tablet (0.5 mg total) by mouth daily. (Patient not taking: Reported on 04/17/2017) 30 tablet 11  . progesterone (PROMETRIUM) 100 MG capsule Take 1 cap HS Day 1-12 of every other mos. (Patient not taking: Reported on 04/17/2017) 12 capsule 2   No facility-administered medications prior to visit.       Review of Systems  Constitutional: Negative for fever and unexpected weight change.  HENT: Negative for congestion, dental problem, ear pain, nosebleeds, postnasal drip, rhinorrhea, sinus pressure, sneezing, sore throat and trouble swallowing.   Eyes: Negative for redness and itching.  Respiratory: Positive for shortness of breath. Negative for  cough, chest tightness and wheezing.   Cardiovascular: Negative for palpitations and leg swelling.  Gastrointestinal: Negative for nausea and vomiting.  Genitourinary: Negative for dysuria.  Musculoskeletal: Negative for joint swelling.  Skin: Negative for rash.  Neurological: Negative for headaches.  Hematological: Does not bruise/bleed easily.  Psychiatric/Behavioral: Negative for dysphoric mood. The patient is not nervous/anxious.        Objective:   Physical Exam Vitals:   04/17/17 1043  BP: 112/68  Pulse: 74  SpO2: 99%  Weight: 133 lb (60.3 kg)  Height: 5\' 2"  (1.575 m)   Gen: well appearing, no acute distress HENT: NCAT, OP clear, neck supple without masses Eyes: PERRL, EOMi Lymph: no cervical lymphadenopathy PULM: CTA B CV: RRR, no mgr, no JVD GI: BS+,  soft, nontender, no hsm Derm: no rash or skin breakdown MSK: normal bulk and tone Neuro: A&Ox4, CN II-XII intact, strength 5/5 in all 4 extremities Psyche: normal mood and affect   Records from her visit with Dr. Einar Gip in June 2018 reviewed, he notes that she has a history of DVT and pulmonary embolism in February 2018. She had also experienced a spontaneous DVT in 2015. She takes estrogen therapy.  Echocardiogram: June 2018 LVEF 55%, normal left atrium, mild tricuspid regurgitation with no evidence of pulmonary hypertension, PA systolic pressure 26 mmHg, IVC dilated  Chest imaging: June 2018 VQ scan no evidence of pulmonary embolism  February 2018 CT chest images independently reviewed showing low lung volumes, basilar atelectasis and groundglass but no obvious pulmonary parenchymal abnormality otherwise, submassive pulmonary embolism noted with RV dilation and bilateral PE     Assessment & Plan:   History of pulmonary embolus (PE) - Plan: Pulmonary function test  Dyspnea, unspecified type - Plan: Pulmonary function test, CBC w/Diff  Weakness - Plan: CBC w/Diff  Acute deep vein thrombosis (DVT) of femoral vein of left lower extremity ([place])  Discussion: Ariel presents today for evaluation of ongoing fatigue and dyspnea after she had a large pulmonary embolism earlier this year. This was her second lifelong pulmonary embolism and was likely at least in part due to her use of estrogen medicines. However, considering the fact that she's had to blood clot events over 3 years she needs lifelong anticoagulation. At some point we will be able to decrease the dose by 50%.  She continues to struggle with shortness of breath. Her physical exam today is within normal limits and there are really no appreciable pulmonary parenchymal abnormality seen on her CT scan of the chest in February 2018. I think the fatigue and dyspnea are due primarily to ongoing joint aches probably from Eliquis and a  12 pound weight gain she's had over the last several months. I'm hoping that by changing her off of Eliquis 2 Xarelto she'll see some improvements in her joint aches and she'll start to exercise again. It's also normal for people after a large pulmonary embolism to feel fairly short of breath for several months after the event. We will check PFT's and a CBC for completeness sake as part of her work up for PE.  She has a cough recently, likely acid reflux related in the setting of 12 pound weight gain. We discussed lifestyle modifications today.  Plan: For all the joint and muscle aches: I think this is due to Eliquis Stop Eliquis, start Xarelto, discuss this with your pharmacist  For shortness of breath: We will check a lung function tests We will check a 6 minute walk test where we  measure your oxygen level We will check a complete blood count to make sure you are not anemic  For the history of recurrent DVT/pulmonary embolism: He will need lifelong anticoagulation, we will use Xarelto In the future we can decrease the dose of Xarelto but for now I when she to remain on normal dosing  F/u in 5-6 weeks    Current Outpatient Prescriptions:  .  apixaban (ELIQUIS) 5 MG TABS tablet, Take 5 mg by mouth 2 (two) times daily., Disp: , Rfl:  .  beta carotene 10000 UNIT capsule, Take 50,000 Units by mouth daily., Disp: , Rfl:  .  Biotin 10000 MCG TBDP, Take 10,000 mcg by mouth daily., Disp: , Rfl:  .  Black Cohosh 540 MG CAPS, Take 1 capsule by mouth daily., Disp: , Rfl:  .  loratadine (CLARITIN) 10 MG tablet, Take 10 mg by mouth daily., Disp: , Rfl:  .  Multiple Vitamin (MULTIVITAMIN) tablet, Take 1 tablet by mouth daily., Disp: , Rfl:  .  RASPBERRY KETONES PO, Take 500 mg by mouth daily., Disp: , Rfl:  .  Turmeric 500 MG TABS, Take 1 tablet by mouth daily., Disp: , Rfl:  .  UNABLE TO FIND, Take 1 capsule by mouth daily. Med Name: Micromax (dehydrated seaweed meal), Disp: , Rfl:

## 2017-04-18 ENCOUNTER — Ambulatory Visit (INDEPENDENT_AMBULATORY_CARE_PROVIDER_SITE_OTHER): Payer: 59 | Admitting: Pulmonary Disease

## 2017-04-18 ENCOUNTER — Telehealth: Payer: Self-pay | Admitting: Pulmonary Disease

## 2017-04-18 DIAGNOSIS — Z86711 Personal history of pulmonary embolism: Secondary | ICD-10-CM

## 2017-04-18 DIAGNOSIS — R0600 Dyspnea, unspecified: Secondary | ICD-10-CM

## 2017-04-18 LAB — PULMONARY FUNCTION TEST
DL/VA % PRED: 117 %
DL/VA: 5.51 ml/min/mmHg/L
DLCO COR % PRED: 113 %
DLCO COR: 26.07 ml/min/mmHg
DLCO unc % pred: 108 %
DLCO unc: 24.79 ml/min/mmHg
FEF 25-75 POST: 3.57 L/s
FEF 25-75 Pre: 3.53 L/sec
FEF2575-%CHANGE-POST: 1 %
FEF2575-%PRED-POST: 140 %
FEF2575-%PRED-PRE: 139 %
FEV1-%CHANGE-POST: 0 %
FEV1-%PRED-POST: 105 %
FEV1-%Pred-Pre: 106 %
FEV1-POST: 2.76 L
FEV1-PRE: 2.77 L
FEV1FVC-%CHANGE-POST: 3 %
FEV1FVC-%PRED-PRE: 108 %
FEV6-%Change-Post: -3 %
FEV6-%PRED-PRE: 99 %
FEV6-%Pred-Post: 95 %
FEV6-Post: 3.09 L
FEV6-Pre: 3.22 L
FEV6FVC-%Change-Post: 0 %
FEV6FVC-%Pred-Post: 103 %
FEV6FVC-%Pred-Pre: 103 %
FVC-%CHANGE-POST: -3 %
FVC-%PRED-POST: 93 %
FVC-%PRED-PRE: 96 %
FVC-POST: 3.1 L
FVC-PRE: 3.22 L
PRE FEV1/FVC RATIO: 86 %
Post FEV1/FVC ratio: 89 %
Post FEV6/FVC ratio: 100 %
Pre FEV6/FVC Ratio: 100 %
RV % pred: 100 %
RV: 1.82 L
TLC % pred: 100 %
TLC: 4.92 L

## 2017-04-18 MED ORDER — RIVAROXABAN 20 MG PO TABS: 20.0000 mg | ORAL_TABLET | Freq: Every day | ORAL | 0 refills | Status: DC

## 2017-04-18 NOTE — Progress Notes (Signed)
PFT done today. 

## 2017-04-18 NOTE — Telephone Encounter (Signed)
For the history of recurrent DVT/pulmonary embolism: He will need lifelong anticoagulation, we will use Xarelto In the future we can decrease the dose of Xarelto but for now I when she to remain on normal dosing  F/u in 5-6 weeks  I sent in another Rx to hold her over until she come back into vacation. Pt understood and nothing further is needed.

## 2017-05-26 ENCOUNTER — Encounter: Payer: Self-pay | Admitting: Adult Health

## 2017-05-26 ENCOUNTER — Ambulatory Visit (INDEPENDENT_AMBULATORY_CARE_PROVIDER_SITE_OTHER): Payer: 59 | Admitting: *Deleted

## 2017-05-26 ENCOUNTER — Ambulatory Visit (INDEPENDENT_AMBULATORY_CARE_PROVIDER_SITE_OTHER): Payer: 59 | Admitting: Adult Health

## 2017-05-26 DIAGNOSIS — R0602 Shortness of breath: Secondary | ICD-10-CM | POA: Diagnosis not present

## 2017-05-26 DIAGNOSIS — R0600 Dyspnea, unspecified: Secondary | ICD-10-CM | POA: Diagnosis not present

## 2017-05-26 DIAGNOSIS — I2699 Other pulmonary embolism without acute cor pulmonale: Secondary | ICD-10-CM

## 2017-05-26 MED ORDER — RIVAROXABAN 20 MG PO TABS: 20.0000 mg | ORAL_TABLET | Freq: Every day | ORAL | 5 refills | Status: DC

## 2017-05-26 NOTE — Progress Notes (Signed)
@Patient  ID: Julie Harding, female    DOB: 10/06/1960, 56 y.o.   MRN: [id number]  Chief Complaint  Patient presents with  . Follow-up    PE    Referring provider: Velna Hatchet, MD  HPI: 56 year old female seen for pulmonary consult April 17, 2017 for evaluation of DVT and PE Patient had initial unprovoked DVT PE 2015.  Hypercoagulable panel was negative. Recurrent PE  February 2018 while taking estrogen therapy  TEST  Echocardiogram: June 2018 LVEF 55%, normal left atrium, mild tricuspid regurgitation with no evidence of pulmonary hypertension, PA systolic pressure 26 mmHg, IVC dilated  Chest imaging: June 2018 VQ scan no evidence of pulmonary embolism  February 2018 CT chest images independently reviewed showing low lung volumes, basilar atelectasis and groundglass but no obvious pulmonary parenchymal abnormality otherwise, submassive pulmonary embolism noted with RV dilation and bilateral PE  V. Doppler 12/2016 >neg DVT   PFT April 18, 2017 showed FEV1 at 105%, ratio 89, FVC 93% no significant bronchodilator response.  DLCO 108%.  05/26/2017 Follow up : PE  Patient returns for a one-month follow-up.  Patient was seen last visit for pulmonary consult for shortness of breath and fatigue.  Patient had recurrent PE in February 2018.  She was taking estrogen therapy at the time.  Previous unprovoked PE was in 2015.  Patient was started on Eliquis in February.  Patient had weight gain joint aches.  She had ongoing shortness of breath and fatigue over the last several months.  VQ scan in June 2018 showed no evidence of PE.  Venous Doppler showed no DVT.    Patient was recommended for lifelong anticoagulation due the fact of recurrent PE.  Last visit .She was changed from Eliquis to Xarelto.  Patient was set up for a pulmonary function test showed normal lung function with no airflow obstruction or restriction.Marland Kitchen CBC was normal.  6-minute walk test 528 m.  No desaturations  with O2 saturations at 98-100%.. Since last visit patient is feeling better. Joint aches are significantly better.  She is not back to her exercise/activity tolerance but is better. Dyspnea  seems to be getting some better . Mild in nature.  No known bleeding . No chest pain .       Allergies  Allergen Reactions  . Vicodin [Hydrocodone-Acetaminophen] Nausea And Vomiting     There is no immunization history on file for this patient.  Past Medical History:  Diagnosis Date  . DVT (deep venous thrombosis) (HCC)    Right leg  . Pulmonary embolus ([place]) 08/2016    Tobacco History: History  Smoking Status  . Never Smoker  Smokeless Tobacco  . Never Used   Counseling given: Not Answered   Outpatient Encounter Prescriptions as of 05/26/2017  Medication Sig  . beta carotene 10000 UNIT capsule Take 50,000 Units by mouth daily.  . Biotin 10000 MCG TBDP Take 10,000 mcg by mouth daily.  . Black Cohosh 540 MG CAPS Take 1 capsule by mouth daily.  Marland Kitchen loratadine (CLARITIN) 10 MG tablet Take 10 mg by mouth daily.  . Multiple Vitamin (MULTIVITAMIN) tablet Take 1 tablet by mouth daily.  Marland Kitchen RASPBERRY KETONES PO Take 500 mg by mouth daily.  . rivaroxaban (XARELTO) 20 MG TABS tablet Take 1 tablet (20 mg total) by mouth daily with supper.  . Rivaroxaban 15 & 20 MG TBPK Take one 15mg  tab by mouth twice a day with food. On Day 22, switch to one 20mg  tab once a day  with food.  . Turmeric 500 MG TABS Take 1 tablet by mouth daily.  Marland Kitchen UNABLE TO FIND Take 1 capsule by mouth daily. Med Name: Micromax (dehydrated seaweed meal)   No facility-administered encounter medications on file as of 05/26/2017.      Review of Systems  Constitutional:   No  weight loss, night sweats,  Fevers, chills, + fatigue, or  lassitude.  HEENT:   No headaches,  Difficulty swallowing,  Tooth/dental problems, or  Sore throat,                No sneezing, itching, ear ache, nasal congestion, post nasal drip,   CV:  No chest  pain,  Orthopnea, PND, swelling in lower extremities, anasarca, dizziness, palpitations, syncope.   GI  No heartburn, indigestion, abdominal pain, nausea, vomiting, diarrhea, change in bowel habits, loss of appetite, bloody stools.   Resp: No shortness of breath with exertion or at rest.  No excess mucus, no productive cough,  No non-productive cough,  No coughing up of blood.  No change in color of mucus.  No wheezing.  No chest wall deformity  Skin: no rash or lesions.  GU: no dysuria, change in color of urine, no urgency or frequency.  No flank pain, no hematuria   MS:  No joint pain or swelling.  No decreased range of motion.       Physical Exam  BP 116/70 (BP Location: Left Arm, Cuff Size: Normal)   Pulse 78   Ht 5\' 2"  (1.575 m)   Wt 130 lb 6.4 oz (59.1 kg)   SpO2 99%   BMI 23.85 kg/m    GEN: A/Ox3; pleasant , NAD, well nourished    HEENT:  [place]/AT,  EACs-clear, TMs-wnl, NOSE-clear, THROAT-clear, no lesions, no postnasal drip or exudate noted.   NECK:  Supple w/ fair ROM; no JVD; normal carotid impulses w/o bruits; no thyromegaly or nodules palpated; no lymphadenopathy.    RESP  Clear  P & A; w/o, wheezes/ rales/ or rhonchi. no accessory muscle use, no dullness to percussion  CARD:  RRR, no m/r/g, no peripheral edema, pulses intact, no cyanosis or clubbing.  GI:   Soft & nt; nml bowel sounds; no organomegaly or masses detected.   Musco: Warm bil, no deformities or joint swelling noted.   Neuro: alert, no focal deficits noted.    Skin: Warm, no lesions or rashes    Lab Results:  CBC    Component Value Date/Time   WBC 6.8 04/17/2017 1144   RBC 4.61 04/17/2017 1144   HGB 14.8 04/17/2017 1144   HCT 44.4 04/17/2017 1144   PLT 296.0 04/17/2017 1144   MCV 96.4 04/17/2017 1144   MCHC 33.3 04/17/2017 1144   RDW 12.4 04/17/2017 1144   LYMPHSABS 2.4 04/17/2017 1144   MONOABS 0.6 04/17/2017 1144   EOSABS 0.3 04/17/2017 1144   BASOSABS 0.0 04/17/2017 1144     BMET No results found for: NA, K, CL, CO2, GLUCOSE, BUN, CREATININE, CALCIUM, GFRNONAA, GFRAA  BNP No results found for: BNP  ProBNP No results found for: PROBNP  Imaging: No results found.   Assessment & Plan:   Pulmonary embolism (HCC) Recurrent PE - on lifelong anticoagulation  Intolerant to Eliquis with joint aches and weight gain Seems to be toleraing Xarelto .   Plan  Patient Instructions  Continue on Xarelto 20mg  daily.  Advance activity as tolerated  Follow up with Dr. [place] Bells in 4 months and As needed  Dyspnea Dyspnea -workup has been unrevealing with neg VQ scan . PFT normal . Walk test normal with no desats. CBC normal . Echo ok .  Would advance activity as tolerated.   Plan  Patient Instructions  Continue on Xarelto 20mg  daily.  Advance activity as tolerated  Follow up with Dr. [place] Bells in 4 months and As needed          Rexene Edison, NP 05/26/2017

## 2017-05-26 NOTE — Addendum Note (Signed)
Addended by: Parke Poisson E on: 05/26/2017 11:08 AM   Modules accepted: Orders

## 2017-05-26 NOTE — Progress Notes (Signed)
SIX MIN WALK 05/26/2017  Medications beta carotene,biotin,loratadine, MVI, raspberry ketones, tumeric,micromax at 8:15 am today  Supplimental Oxygen during Test? (L/min) No  Laps 11  Partial Lap (in Meters) 0  Baseline BP (sitting) 100/64  Baseline Heartrate 65  Baseline Dyspnea (Borg Scale) 0.5  Baseline Fatigue (Borg Scale) 3  Baseline SPO2 100  BP (sitting) 110/68  Heartrate 93  Dyspnea (Borg Scale) 0.5  Fatigue (Borg Scale) 3  SPO2 99  BP (sitting) 104/64  Heartrate 80  SPO2 98  Stopped or Paused before Six Minutes No  Distance Completed 528  Tech Comments: pt completed 6 min walk without any diffuculties

## 2017-05-26 NOTE — Assessment & Plan Note (Signed)
Dyspnea -workup has been unrevealing with neg VQ scan . PFT normal . Walk test normal with no desats. CBC normal . Echo ok .  Would advance activity as tolerated.   Plan  Patient Instructions  Continue on Xarelto 20mg  daily.  Advance activity as tolerated  Follow up with Dr. [place] Bells in 4 months and As needed

## 2017-05-26 NOTE — Patient Instructions (Addendum)
Continue on Xarelto 20mg  daily.  Advance activity as tolerated  Follow up with Dr. [place] Bells in 4 months and As needed

## 2017-05-26 NOTE — Assessment & Plan Note (Signed)
Recurrent PE - on lifelong anticoagulation  Intolerant to Eliquis with joint aches and weight gain Seems to be toleraing Xarelto .   Plan  Patient Instructions  Continue on Xarelto 20mg  daily.  Advance activity as tolerated  Follow up with Dr. [place] Bells in 4 months and As needed

## 2017-05-26 NOTE — Progress Notes (Signed)
Reviewed, agree 

## 2017-06-13 DIAGNOSIS — M4316 Spondylolisthesis, lumbar region: Secondary | ICD-10-CM | POA: Diagnosis not present

## 2017-06-13 DIAGNOSIS — M4726 Other spondylosis with radiculopathy, lumbar region: Secondary | ICD-10-CM | POA: Diagnosis not present

## 2017-06-13 DIAGNOSIS — Q760 Spina bifida occulta: Secondary | ICD-10-CM | POA: Diagnosis not present

## 2017-06-13 DIAGNOSIS — M545 Low back pain: Secondary | ICD-10-CM | POA: Diagnosis not present

## 2017-06-21 ENCOUNTER — Other Ambulatory Visit: Payer: Self-pay | Admitting: Orthopedic Surgery

## 2017-06-21 DIAGNOSIS — M4316 Spondylolisthesis, lumbar region: Secondary | ICD-10-CM

## 2017-06-25 ENCOUNTER — Ambulatory Visit
Admission: RE | Admit: 2017-06-25 | Discharge: 2017-06-25 | Disposition: A | Discharge status: Home / Self Care | Payer: 59 | Source: Ambulatory Visit | Attending: Orthopedic Surgery | Admitting: Orthopedic Surgery

## 2017-06-25 DIAGNOSIS — M5126 Other intervertebral disc displacement, lumbar region: Secondary | ICD-10-CM | POA: Diagnosis not present

## 2017-06-25 DIAGNOSIS — M4316 Spondylolisthesis, lumbar region: Secondary | ICD-10-CM

## 2017-06-29 ENCOUNTER — Ambulatory Visit: Payer: 59 | Admitting: Family Medicine

## 2017-08-04 ENCOUNTER — Telehealth: Payer: Self-pay | Admitting: Family Medicine

## 2017-08-04 ENCOUNTER — Ambulatory Visit: Payer: 59 | Admitting: Family Medicine

## 2017-08-04 ENCOUNTER — Encounter: Payer: Self-pay | Admitting: Family Medicine

## 2017-08-04 VITALS — BP 120/84 | HR 76 | Ht 62.0 in | Wt 134.0 lb

## 2017-08-04 DIAGNOSIS — M48061 Spinal stenosis, lumbar region without neurogenic claudication: Secondary | ICD-10-CM | POA: Insufficient documentation

## 2017-08-04 DIAGNOSIS — R5383 Other fatigue: Secondary | ICD-10-CM

## 2017-08-04 DIAGNOSIS — Z86718 Personal history of other venous thrombosis and embolism: Secondary | ICD-10-CM

## 2017-08-04 DIAGNOSIS — M549 Dorsalgia, unspecified: Secondary | ICD-10-CM

## 2017-08-04 DIAGNOSIS — Z818 Family history of other mental and behavioral disorders: Secondary | ICD-10-CM

## 2017-08-04 DIAGNOSIS — G8929 Other chronic pain: Secondary | ICD-10-CM

## 2017-08-04 DIAGNOSIS — I2699 Other pulmonary embolism without acute cor pulmonale: Secondary | ICD-10-CM

## 2017-08-04 DIAGNOSIS — N951 Menopausal and female climacteric states: Secondary | ICD-10-CM | POA: Diagnosis not present

## 2017-08-04 DIAGNOSIS — M153 Secondary multiple arthritis: Secondary | ICD-10-CM

## 2017-08-04 DIAGNOSIS — I82412 Acute embolism and thrombosis of left femoral vein: Secondary | ICD-10-CM

## 2017-08-04 DIAGNOSIS — Z87898 Personal history of other specified conditions: Secondary | ICD-10-CM

## 2017-08-04 DIAGNOSIS — Z90710 Acquired absence of both cervix and uterus: Secondary | ICD-10-CM | POA: Diagnosis not present

## 2017-08-04 DIAGNOSIS — K921 Melena: Secondary | ICD-10-CM

## 2017-08-04 DIAGNOSIS — Y9302 Activity, running: Secondary | ICD-10-CM | POA: Diagnosis not present

## 2017-08-04 DIAGNOSIS — D689 Coagulation defect, unspecified: Secondary | ICD-10-CM

## 2017-08-04 DIAGNOSIS — F4522 Body dysmorphic disorder: Secondary | ICD-10-CM

## 2017-08-04 HISTORY — DX: Acute embolism and thrombosis of left femoral vein: I82.412

## 2017-08-04 MED ORDER — FLUOXETINE HCL 20 MG PO TABS: 20.0000 mg | ORAL_TABLET | Freq: Every day | ORAL | 1 refills | Status: DC

## 2017-08-04 NOTE — Patient Instructions (Addendum)
Start with a half a tablet of the fluoxetine\Prozac per day.  Do not take it as written on the prescription.  Take it for 2-3 weeks at a half a tablet a day make sure you tolerate it well then go to a full tablet  - near future come in for fasting bldwrk- then 2 wks f/up with me    -Also we need to transition your brain into thinking more positively.  These tasks below are some things I want you to do every day 1)  write 3 new things that you are grateful for every day for 21 days  2)  exercise daily- walk for 15 minutes twice a day every day 3)  you are going to journal every day about one positive experience that you had 4)  meditate every day.  You can go on YouTube and look for 15-minute relaxation meditation or what ever.  But we need to make sure that you are in the moment and relaxing and deep breathing every day 5)  Write 1 positive email every day to praise someone in your life     - If you have insomnia or difficulty sleeping, this information is for you:  - Avoid caffeinated beverages after lunch,  no alcoholic beverages,  no eating within 2-3 hours of lying down,  avoid exposure to [place] light before bed,  avoid daytime naps, and  needs to maintain a regular sleep schedule- go to sleep and wake up around the same time every night.   - Resolve concerns or worries before entering bedroom:  Discussed relaxation techniques with patient and to keep a journal to write down fears\ worries.  I suggested seeing a counselor for CBT.   - Recommend patient meditate or do deep breathing exercises to help relax.   Incorporate the use of white noise machines or listen to "sleep meditation music", or recordings of guided meditations for sleep from YouTube which are free, such as  "guided meditation for detachment from over thinking"  by Mayford Knife.

## 2017-08-04 NOTE — Telephone Encounter (Signed)
Patient called states she went to pharmacy to get Rx : FLUoxetine (PROZAC) 20 MG tablet [[id number]]  Order Details  Dose: 20 mg Route: Oral Frequency: Daily  Indications of Use: Body Dysmorphic Disorder  Dispense Quantity: 30 tablet Refills: 1 Fills remaining: --        Sig: Take 1 tablet (20 mg total) by mouth daily.          ---Patient states provider told her to take 1/2 tablets but pharmacy had only (capsules) , so pt did not get Rx filled she request provider/ medical assistant contact her at [phone] to advise on what to do-- If she should get the capsules which are less expensive than the tablets---- Tablets as prescribed she can follow dosing instructions by Dr. Jenetta Downer.  Just call her to advise.  --glh

## 2017-08-04 NOTE — Progress Notes (Signed)
New patient office visit note:  Impression and Recommendations:    1. Body image disorder   2. History of DVT (deep vein thrombosis)   3. Fatigue, unspecified type   4. Postmenopausal disorder   5. h/o Pulmonary embolism and infarction ([place])   6. Chronic back pain greater than 3 months duration   7. H/O total hysterectomy   8. Spinal stenosis of lumbar region without neurogenic claudication   9. Exercise involving running- 20m/ day   10. Family history of depression   11. Other pulmonary embolism without acute cor pulmonale, unspecified chronicity ([place]   12. Perimenopausal vasomotor symptoms-  sweating terribly  (mood changes as well)   13. Acute deep vein thrombosis (DVT) of femoral vein of left lower extremity ([facility])   14. History of excessive sweating - menopausal   15. Clotting disorder ([place]   16. h/o Blood in the stool   17. Post-traumatic osteoarthritis of multiple joints      Education and routine counseling performed. Handouts provided.  Body Image Disorder  - Advised patient to begin doing strength training exercises to cross-train her body. Running alone will not help her achieve her change her body and achieve personal physical goals, I rec she do crosstraining and elliptical and activities that are easier on her jts!!  Her high amounts of running are not good for her body. Recommended lunges, sit-ups, going on YouTube to look at core strengthening programs. Putting on more muscle mass should allow her to burn calories more easily. - extensive BMI counseling and body image issues d/c pt.  - Pt refuses nutritional counseling referral, declines t ogo to the gym - Encouraged to continue healthy dietary habits, - Rec NOT wt loss, but to get healthier and "more fit".  - Patient should continue consuming adequate amounts of water - half of body weight in oz of water per day.  - Nutrition counseling recommended to the Pt for her weight concerns and body dysmorphia-  she declined.  Pt declined counseling at this time.  Otherwise, recommended Lose It or MyFitnessPal app to track exercise, food, and other habits.   Mood - Recommended continuing on Black Cohosh for her mood. Patient will look into things to [facility] improving her [facility].   - Discussed the fact that menopause can especially wreak havoc on moods.  -  Prozac recommended and prescribed.   Patient aware that medicine can cause increased sweating, but we discussed her body dysmorphic syndrome and depressive symptoms, and felt that the benefits of the med are greater than the risk.  -After risks and benefits, patient would like to try the Prozac.  Pt will start on fluoxetine/Prozac. Begin with 1/2 tablet a day, make sure there is no sweating - do NOT take as written, but instead taper up from a half dose. Take for 2-3 weeks at 1/2 tablet a day, make sure she tolerates it well, and then go up to a full tablet.   She will let [place] if she has any side effects or if her sweating gets worse, we will have to discontinue the medicine.  We started very low dose for this very reason, to decrease chance of side-effect.  Follow-up - Pt should return for fasting labs at her convenience. Thyroid, Vitmain D, kidney function, liver function, cholesterol, A1c, electrolytes.   - Follow-up after labs.  Pt was in the office today for 40+ minutes, with over 50% time spent in  face to face counseling of patients various medical conditions, treatment plans of those medical conditions including medicine management and lifestyle modification, strategies to improve health and well being; and in coordination of care. SEE ABOVE FOR DETAILS   Orders Placed This Encounter  Procedures  . CBC with Differential/Platelet  . Comprehensive metabolic panel  . Hemoglobin A1c  . Lipid panel  . T3, free  . T4, free  . TSH  . Vitamin B12  . VITAMIN D 25 Hydroxy (Vit-D Deficiency, Fractures)    Meds  ordered this encounter  Medications  . DISCONTD: FLUoxetine (PROZAC) 20 MG tablet    Sig: Take 1 tablet (20 mg total) by mouth daily.    Dispense:  30 tablet    Refill:  1  . FLUoxetine (PROZAC) 20 MG tablet    Sig: Take 1 tablet (20 mg total) by mouth daily.    Dispense:  30 tablet    Refill:  1    Gross side effects, risk and benefits, and alternatives of medications discussed with patient.  Patient is aware that all medications have potential side effects and we are unable to predict every side effect or drug-drug interaction that may occur.  Expresses verbal understanding and consents to current therapy plan and treatment regimen.  Return for Fasting bldwrk-near future;then OV w me 1 wk later.  Please see AVS handed out to patient at the end of our visit for further patient instructions/ counseling done pertaining to today's office visit.    Note: This document was prepared using Dragon voice recognition software and may include unintentional dictation errors.   This document serves as a record of services personally performed by Mellody Dance, DO. It was created on her behalf by Toni Amend, a trained medical scribe. The creation of this record is based on the scribe's personal observations and the provider's statements to them.   I have reviewed the above medical documentation for accuracy and completeness and I concur.  Mellody Dance 08/05/17 12:32 PM   ----------------------------------------------------------------------------------------------------------------------    Subjective:    Chief complaint:   Chief Complaint  Patient presents with  . Establish Care    HPI: Julie Harding is a pleasant 57 y.o. female who presents to [place] [facility] today to review their medical history with me and establish care.   I asked the patient to review their chronic problem list with me to ensure everything was updated and accurate.    All  recent office visits with other providers, any medical records that patient brought in etc  - I reviewed today.     We asked pt to get [place] their medical records from [facility] providers/ specialists that they had seen within the past 3-5 years- if they are in private practice and/or do not work for [employer], [facility], [place], [place] or [employer] owned practice.  Told them to call their specialists to clarify this if they are not sure.   Social History - Never smoker - Wine drinker, one glass of red each night. At times, she will go up to 2 glasses of wine on occasion. - Homemaker mostly. Retired in 2002.  - Married for 21 years to her second husband, no kids. Furry kids include 3 horses and 2 dogs. - Originally from [place] Moved [place] of 1995. First husband Nicole Kindred was business executive in Horticulturist, commercial. Tony's job transferred him down here. Soon thereafter, he had a massive heart attack and he  died. - Pt Widowed at age 16. Planned to move back [place], but was kept down [place] by her old horse. Horse lived for 4 years.  - By that time, she had met and married her current husband, had friends, had a job. - Husband was Set designer for 30 years; he was born and raised in [place].   - She & her husband are "addicted" to Minor League baseball. [place], but [place]'t yet because they can't find a home close enough to baseball. Season ticket holders to the [employer].  Family History - Prostate cancer and throat cancer in her father. - Had been a smoker for years, quit. Developed cancer years later. - Father had HTN, onset in 30's-60's.  - Mother had likely depression, undiagnosed. Mother was a very "tough" personality. - Mother refused to go to the doctor; pt has no idea about her history. - One sister and two brothers. She is the youngest of her siblings. - Oldest brother has had lifelong heart issues - AVRT. Had ablation surgery several  years ago that was very successful. - Another brother had a massive stroke last year, at age 80. Obese, smoker, heavy drinker. - Sister very healthy. Lost her husband last year, dealt with it well, but more of the "snowflake" of the family.  Surgical History - Hysterectomy in 2006. - Supracervical laparoscopic hysterectomy.   PE/DVT - History of bilateral PE,  Also DVT yrs prior. - Did not like her previous PCP at [facility].   - Denies swelling in her legs today. SHe conts to jog 7+  Miles per day- over 50 / wk.    OBGYN - Sweating is a problem with her menopause. - Dr. Phineas Real is her OBGYN.  - Takes Black Cohosh for her menopausal sx but vasomotor sx the worst, also emotional difficulties - HRT- C/I with her coagulapathy hx.  Mood - Lost a pet dog, and then her father and mother  10 days later all over the course of one year about a decade ago.  Has batled depression issues since.  Also was on meds with death of first husband.  - In some respects, she feels she's very happy. She is very close with her husband.  - In other aspects, she's terribly unhappy. Part of her unhappiness is caused by her body image- thinks she is "too flabby and fat." - Went on Effexor when her mom died and "swelled up like a tick." -  She had long d/c her GYN re HRT but After the estradiol-->  had PE, and everything, they told her Effexor could help with menopausal symptoms.  She was sweating excessively.  Does not want to take Effexor again because she does not want to gain weight.   Very c/w wt gain and that she is already too fat.  Eats "REALLY healthy"- only fruits veggies, chicken, [place] etc.  Nothing processed, no sweets, no fast food for over 17yr etc.  She admits to being VERY strict with diet and exercise- appears obsessive with it  Scoliosis/Joints/Spine - Sees an orthopedist for her joints.   is beginning to have problems with her back as well. [facility] - Was told  she has spina bifida, and spinal stenosis. - Has terrible back pain and trouble sleeping. - She refuses to stop jogging and was told to do so for her back and jts pains, but pt cant due to already being too fat.   -  Admits sleeping issues are also situational due to her elderly sick dog who has issues controlling her bowels.  Dermatology - Martin Majestic to [facility] once, but cannot remember his name. She does not see a dermatologist yearly, but will start to  Diet/Exercise - She and her husband eat "very clean".   Haven't had and hot dog or fast food in 25-30 years. Almost all of their red meat is venison- othewrwise chicken and [place]. They like eating things from their backyard. Doesn't eat anything that swims. Consumes very little processed food.   - High activity level. Runs 7 miles every morning, runs the horses, plays with the dogs. In the summer, she runs, rides horses, gets in the pool, and goes to the ballpark.  - She is very "number on the scale oriented," and feels the number is too high at present.   Wants it to go down about 15 lbs to 115.  She is largely satisfied with her body, but feels she has fat and fat rolls.  Wants to lose weight, but notes "nothing is happening."  She's tracked foods herself, but never seen a nutritionist. She eats very clean, and believes the weight gain is due to menopause.   No history of HTN, DM, HLD.    Wt Readings from Last 3 Encounters:  08/04/17 134 lb (60.8 kg)  05/26/17 130 lb 6.4 oz (59.1 kg)  04/17/17 133 lb (60.3 kg)   BP Readings from Last 3 Encounters:  08/04/17 120/84  05/26/17 116/70  04/17/17 112/68   Pulse Readings from Last 3 Encounters:  08/04/17 76  05/26/17 78  04/17/17 74   BMI Readings from Last 3 Encounters:  08/04/17 24.51 kg/m  05/26/17 23.85 kg/m  04/17/17 24.33 kg/m    Patient Care Team    Relationship Specialty Notifications Start End  Mellody Dance, DO PCP - General Family Medicine  05/26/17     Adrian Prows, MD Consulting Physician Cardiology  08/04/17   Juanito Doom, MD Consulting Physician Pulmonary Disease  08/04/17   Ninetta Lights, MD Consulting Physician Orthopedic Surgery  08/04/17   Anastasio Auerbach, MD Consulting Physician Gynecology  08/04/17   Gavin Pound, MD Consulting Physician Rheumatology  08/04/17   Rolm Bookbinder, MD Consulting Physician Dermatology  08/04/17    Comment: Eilene Ghazi, MD Referring Physician Orthopedic Surgery  08/04/17    Comment: spine center  Clarene Essex, MD Consulting Physician Gastroenterology  08/04/17    Comment: Colonoscopy  Regal, Tamala Fothergill, DPM Consulting Physician Podiatry  08/04/17     Patient Active Problem List   Diagnosis Date Noted  . Postmenopausal disorder 08/04/2017    Priority: High  . Body image disorder 08/04/2017    Priority: High  . History of DVT (deep vein thrombosis) 08/04/2017    Priority: Medium  . Spinal stenosis, lumbar- chronic back pain; Dr Sherlyn Lick 08/04/2017    Priority: Medium  . Acute deep vein thrombosis (DVT) of femoral vein of left lower extremity ([place]) 08/04/2017    Priority: Medium  . Clotting disorder ([place]) 08/05/2017  . Degenerative joint disease involving multiple joints 08/05/2017  . Fatigue 08/04/2017  . Chronic back pain greater than 3 months duration 08/04/2017  . H/O total hysterectomy 08/04/2017  . Exercise involving running- 71m/ day 08/04/2017  . Family history of depression 08/04/2017  . Perimenopausal vasomotor symptoms-  sweating terribly  (mood changes as well) 08/04/2017  . History of excessive sweating - menopausal 08/04/2017  . h/o  Pulmonary embolism ([place]) 05/26/2017     Past Medical History:  Diagnosis Date  . DVT (deep venous thrombosis) (H[facility])    Right leg  . Pulmonary embolus ([place]) 08/2016     Past Medical History:  Diagnosis Date  . DVT (deep venous thrombosis) (H[facility])    Right leg  . Pulmonary embolus ([place]) 08/2016     Past  Surgical History:  Procedure Laterality Date  . LAPAROSCOPIC SUPRACERVICAL HYSTERECTOMY  2007   Leiomyoma  . ROTATOR CUFF REPAIR    . thumb surg    . TUBAL LIGATION       Family History  Problem Relation Age of Onset  . COPD Mother   . Cancer Father        Throat,Prostate  . Heart disease Brother      Social History   Substance and Sexual Activity  Drug Use No     Social History   Substance and Sexual Activity  Alcohol Use Yes  . Alcohol/week: 4.8 oz  . Types: 8 Standard drinks or equivalent per week     Social History   Tobacco Use  Smoking Status Never Smoker  Smokeless Tobacco Never Used     Current Meds  Medication Sig  . beta carotene 10000 UNIT capsule Take 50,000 Units by mouth daily.  . Biotin 10000 MCG TBDP Take 10,000 mcg by mouth daily.  . Black Cohosh 540 MG CAPS Take 1 capsule by mouth daily.  Marland Kitchen gabapentin (NEURONTIN) 300 MG capsule Take 300 mg by mouth daily.  Marland Kitchen loratadine (CLARITIN) 10 MG tablet Take 10 mg by mouth daily.  . Multiple Vitamin (MULTIVITAMIN) tablet Take 1 tablet by mouth daily.  Marland Kitchen RASPBERRY KETONES PO Take 500 mg by mouth daily.  . rivaroxaban (XARELTO) 20 MG TABS tablet Take 1 tablet (20 mg total) by mouth daily with supper.  . Turmeric 500 MG TABS Take 1 tablet by mouth daily.  . [DISCONTINUED] UNABLE TO FIND Take 1 capsule by mouth daily. Med Name: Micromax (dehydrated seaweed meal)    Allergies: Vicodin [hydrocodone-acetaminophen]   Review of Systems  Constitutional: Negative for chills, diaphoresis, fever, malaise/fatigue and weight loss.  HENT: Negative for congestion, sore throat and tinnitus.   Eyes: Negative for blurred vision, double vision and photophobia.  Respiratory: Negative for cough and wheezing.   Cardiovascular: Negative for chest pain and palpitations.  Gastrointestinal: Positive for blood in stool. Negative for diarrhea, nausea and vomiting.       Per pt has blood in bowel movements.    Genitourinary: Negative for dysuria, frequency and urgency.       Nighttime urination  Musculoskeletal: Positive for back pain (Chronic) and joint pain (Chronic). Negative for myalgias.  Skin: Negative for itching and rash.  Neurological: Negative for dizziness, focal weakness, weakness and headaches.  Endo/Heme/Allergies: Negative for environmental allergies and polydipsia. Bruises/bleeds easily (Xarelto).       On Xarelto  Psychiatric/Behavioral: Positive for depression (Chronic). Negative for memory loss. The patient is not nervous/anxious and does not have insomnia.   All other systems reviewed and are negative.    Objective:   Blood pressure 120/84, pulse 76, height 5' 2" (1.575 m), weight 134 lb (60.8 kg), SpO2 98 %. Body mass index is 24.51 kg/m. General: Well Developed, well nourished, and in no acute distress.  Neuro: Alert and oriented x3, extra-ocular muscles intact, sensation grossly intact.  HEENT:[place]/AT, PERRLA, neck supple, No carotid bruits Skin: no gross rashes  Cardiac: Regular rate and rhythm Respiratory:  Essentially clear to auscultation bilaterally. Not using accessory muscles, speaking in full sentences.  Abdominal: not grossly distended Musculoskeletal: Ambulates w/o diff, FROM * 4 ext.  Vasc: less 2 sec cap RF, warm and pink  Psych:  No HI/SI, judgement and insight good, Euthymic mood. Full Affect.

## 2017-08-05 DIAGNOSIS — D689 Coagulation defect, unspecified: Secondary | ICD-10-CM | POA: Insufficient documentation

## 2017-08-05 DIAGNOSIS — M159 Polyosteoarthritis, unspecified: Secondary | ICD-10-CM | POA: Insufficient documentation

## 2017-08-07 NOTE — Telephone Encounter (Signed)
Spoke to the patient on Friday.  Called [facility] and they have 10 mg capsules and will give her 15 day supply until her dosage per Dr. Hershal Coria instructions increases to 20 mg daily.  Patient was notified of this. MPulliam, CMA/RT(R)

## 2017-08-10 ENCOUNTER — Other Ambulatory Visit (INDEPENDENT_AMBULATORY_CARE_PROVIDER_SITE_OTHER): Payer: 59

## 2017-08-10 DIAGNOSIS — Z86718 Personal history of other venous thrombosis and embolism: Secondary | ICD-10-CM | POA: Diagnosis not present

## 2017-08-10 DIAGNOSIS — R5383 Other fatigue: Secondary | ICD-10-CM | POA: Diagnosis not present

## 2017-08-10 DIAGNOSIS — I2699 Other pulmonary embolism without acute cor pulmonale: Secondary | ICD-10-CM

## 2017-08-10 DIAGNOSIS — N951 Menopausal and female climacteric states: Secondary | ICD-10-CM | POA: Diagnosis not present

## 2017-08-11 LAB — COMPREHENSIVE METABOLIC PANEL
A/G RATIO: 1.7 (ref 1.2–2.2)
ALBUMIN: 4.3 g/dL (ref 3.5–5.5)
ALT: 18 IU/L (ref 0–32)
AST: 23 IU/L (ref 0–40)
Alkaline Phosphatase: 78 IU/L (ref 39–117)
BUN / CREAT RATIO: 24 — AB (ref 9–23)
BUN: 16 mg/dL (ref 6–24)
Bilirubin Total: 0.9 mg/dL (ref 0.0–1.2)
CO2: 24 mmol/L (ref 20–29)
CREATININE: 0.68 mg/dL (ref 0.57–1.00)
Calcium: 10.2 mg/dL (ref 8.7–10.2)
Chloride: 104 mmol/L (ref 96–106)
GFR calc Af Amer: 115 mL/min/{1.73_m2} (ref >59)
GFR, EST NON AFRICAN AMERICAN: 99 mL/min/{1.73_m2} (ref >59)
GLOBULIN, TOTAL: 2.6 g/dL (ref 1.5–4.5)
Glucose: 87 mg/dL (ref 65–99)
POTASSIUM: 4.7 mmol/L (ref 3.5–5.2)
SODIUM: 142 mmol/L (ref 134–144)
Total Protein: 6.9 g/dL (ref 6.0–8.5)

## 2017-08-11 LAB — TSH: TSH: 2.02 u[IU]/mL (ref 0.450–4.500)

## 2017-08-11 LAB — CBC WITH DIFFERENTIAL/PLATELET
BASOS: 0 %
Basophils Absolute: 0 10*3/uL (ref 0.0–0.2)
EOS (ABSOLUTE): 0.1 10*3/uL (ref 0.0–0.4)
EOS: 2 %
HEMATOCRIT: 43.7 % (ref 34.0–46.6)
Hemoglobin: 14.6 g/dL (ref 11.1–15.9)
Immature Grans (Abs): 0 10*3/uL (ref 0.0–0.1)
Immature Granulocytes: 0 %
LYMPHS ABS: 2 10*3/uL (ref 0.7–3.1)
Lymphs: 39 %
MCH: 31.7 pg (ref 26.6–33.0)
MCHC: 33.4 g/dL (ref 31.5–35.7)
MCV: 95 fL (ref 79–97)
MONOS ABS: 0.4 10*3/uL (ref 0.1–0.9)
Monocytes: 8 %
NEUTROS ABS: 2.6 10*3/uL (ref 1.4–7.0)
NEUTROS PCT: 51 %
PLATELETS: 275 10*3/uL (ref 150–379)
RBC: 4.61 x10E6/uL (ref 3.77–5.28)
RDW: 12.5 % (ref 12.3–15.4)
WBC: 5.2 10*3/uL (ref 3.4–10.8)

## 2017-08-11 LAB — HEMOGLOBIN A1C
Est. average glucose Bld gHb Est-mCnc: 105 mg/dL
HEMOGLOBIN A1C: 5.3 % (ref 4.8–5.6)

## 2017-08-11 LAB — T4, FREE: Free T4: 1.07 ng/dL (ref 0.82–1.77)

## 2017-08-11 LAB — VITAMIN B12: Vitamin B-12: 387 pg/mL (ref 232–1245)

## 2017-08-11 LAB — LIPID PANEL
Chol/HDL Ratio: 2.7 ratio (ref 0.0–4.4)
Cholesterol, Total: 188 mg/dL (ref 100–199)
HDL: 70 mg/dL (ref >39)
LDL Calculated: 98 mg/dL (ref 0–99)
TRIGLYCERIDES: 99 mg/dL (ref 0–149)
VLDL CHOLESTEROL CAL: 20 mg/dL (ref 5–40)

## 2017-08-11 LAB — T3, FREE: T3 FREE: 3.9 pg/mL (ref 2.0–4.4)

## 2017-08-11 LAB — VITAMIN D 25 HYDROXY (VIT D DEFICIENCY, FRACTURES): VIT D 25 HYDROXY: 43.1 ng/mL (ref 30.0–100.0)

## 2017-08-22 ENCOUNTER — Ambulatory Visit: Payer: 59 | Admitting: Family Medicine

## 2017-08-22 ENCOUNTER — Encounter: Payer: Self-pay | Admitting: Family Medicine

## 2017-08-22 VITALS — BP 119/80 | HR 80 | Ht 62.0 in | Wt 134.0 lb

## 2017-08-22 DIAGNOSIS — R5383 Other fatigue: Secondary | ICD-10-CM | POA: Diagnosis not present

## 2017-08-22 DIAGNOSIS — F39 Unspecified mood [affective] disorder: Secondary | ICD-10-CM

## 2017-08-22 DIAGNOSIS — N951 Menopausal and female climacteric states: Secondary | ICD-10-CM | POA: Diagnosis not present

## 2017-08-22 DIAGNOSIS — I2699 Other pulmonary embolism without acute cor pulmonale: Secondary | ICD-10-CM

## 2017-08-22 DIAGNOSIS — E559 Vitamin D deficiency, unspecified: Secondary | ICD-10-CM | POA: Diagnosis not present

## 2017-08-22 DIAGNOSIS — F4522 Body dysmorphic disorder: Secondary | ICD-10-CM | POA: Diagnosis not present

## 2017-08-22 DIAGNOSIS — D689 Coagulation defect, unspecified: Secondary | ICD-10-CM

## 2017-08-22 DIAGNOSIS — K921 Melena: Secondary | ICD-10-CM | POA: Diagnosis not present

## 2017-08-22 DIAGNOSIS — I82401 Acute embolism and thrombosis of unspecified deep veins of right lower extremity: Secondary | ICD-10-CM

## 2017-08-22 DIAGNOSIS — Z818 Family history of other mental and behavioral disorders: Secondary | ICD-10-CM

## 2017-08-22 DIAGNOSIS — Y9302 Activity, running: Secondary | ICD-10-CM

## 2017-08-22 MED ORDER — VITAMIN D3 50 MCG (2000 UT) PO TABS: 2000.0000 [IU]/h | ORAL_TABLET | Freq: Every day | ORAL | Status: DC

## 2017-08-22 NOTE — Patient Instructions (Addendum)
If after 4 more weeks of being on the 20 mg of fluoxetine if you do not feel some improvement with the negative thoughts or feeling more positive less irritated, call me and let me know because we can go up to 40 mg of fluoxetine.  Tract using the lose it app or my fitness pal app or Control and instrumentation engineer.     Guidelines for a Low Cholesterol, Low Saturated Fat Diet   Fats - Limit total intake of fats and oils. - Avoid butter, stick margarine, shortening, lard, palm and coconut oils. - Limit mayonnaise, salad dressings, gravies and sauces, unless they are homemade with low-fat ingredients. - Limit chocolate. - Choose low-fat and nonfat products, such as low-fat mayonnaise, low-fat or non-hydrogenated peanut butter, low-fat or fat-free salad dressings and nonfat gravy. - Use vegetable oil, such as canola or olive oil. - Look for margarine that does not contain trans fatty acids. - Use nuts in moderate amounts. - Read ingredient labels carefully to determine both amount and type of fat present in foods. Limit saturated and trans fats! - Avoid high-fat processed and convenience foods.  Meats and Meat Alternatives - Choose fish, chicken, [place] and lean meats. - Use dried beans, peas, lentils and tofu. - Limit egg yolks to three to four per week. - If you eat red meat, limit to no more than three servings per week and choose loin or round cuts. - Avoid fatty meats, such as bacon, sausage, franks, luncheon meats and ribs. - Avoid all organ meats, including liver.  Dairy - Choose nonfat or low-fat milk, yogurt and cottage cheese. - Most cheeses are high in fat. Choose cheeses made from non-fat milk, such as mozzarella and ricotta cheese. - Choose light or fat-free cream cheese and sour cream. - Avoid cream and sauces made with cream.  Fruits and Vegetables - Eat a wide variety of fruits and vegetables. - Use lemon juice, vinegar or "mist" olive oil on vegetables. - Avoid adding sauces, fat or  oil to vegetables.  Breads, Cereals and Grains - Choose whole-grain breads, cereals, pastas and rice. - Avoid high-fat snack foods, such as granola, cookies, pies, pastries, doughnuts and croissants.  Cooking Tips - Avoid deep fried foods. - Trim visible fat off meats and remove skin from poultry before cooking. - Bake, broil, boil, poach or roast poultry, fish and lean meats. - Drain and discard fat that drains out of meat as you cook it. - Add little or no fat to foods. - Use vegetable oil sprays to grease pans for cooking or baking. - Steam vegetables. - Use herbs or no-oil marinades to flavor foods.

## 2017-08-22 NOTE — Progress Notes (Signed)
Assessment and plan:  1. Mood disorder ([place])   2. Body image disorder   3. Perimenopausal vasomotor symptoms-  sweating terribly  (mood changes as well)   4. Other pulmonary embolism without acute cor pulmonale, unspecified chronicity ([place])   5. Clotting disorder ([place])   6. Deep vein thrombosis (DVT) of right lower extremity, unspecified chronicity, unspecified vein (HCC)   7. Vitamin D insufficiency   8. Exercise involving running- 70mi/ day   9. Family history of depression   10. Fatigue, unspecified type   11. Blood in stool      Education and routine counseling performed. Handouts provided.   All recent labs reviewed in depth, at length with the patient.  Mood - If after 4 more weeks of being on the 20 mg fluoxetine, if patient does not feel some improvement with her negative thoughts/feeling more positive and less irritated, she will call to increase her dosage to 40 mg fluoxetine.   Weight/Body Image Issues - Patient continues to decline counseling or dietary advice for her weight/body dysmorphia issues.  Body mass index is 24.51 kg/m.  - Recommended that she use weight watchers or other similar guidance tools (LoseIt, MyFitnessPal, CarbManager apps) to help manage her diet.  Patient declines interest in tracking her calories and what she is specifically eating.  She knows that we are available for counseling or referral at any time.  - Advised the patient that cortisol levels can contribute to weight gain, and discussed the fact that any hormonal or emotional changes can affect weight gain.  Beyond actually seeing what she is consuming through the use of a dietary log, further assistance with her weight loss cannot be provided at this time.  She declines referral to nutritionist  GI Concerns - Regarding her recent blood in the stool, the patient should continue taking Xarelto, and begin taking iron and stool  softeners.  If she feels dizzy or has other symptoms, she should return to [place].  - If her rectal bleeding continues or worsens, she should call into Dr. Perley Jain office immediately and ask to be seen sooner than March.  General Health  Vitamin D deficiency: - Patient should begin taking Vitamin D3 OTC supplement, and continue taking her multivitamin.   - Encouraged the patient to drink at least half of her body weight in ounces of water per day.  - Patient should continue with her active lifestyle, meeting recommended levels of 150 minutes of cardio a week, per guidelines established by the [facility].  Follow-up - Patient will return for follow up in 4 months.  If she wants to come in sooner regarding her diet and weight loss, she knows we are available to help.  Pt was in the office today for 40+ minutes, with over 50% time spent in face to face counseling of patients various medical conditions, treatment plans of those medical conditions including medicine management and lifestyle modification, strategies to improve health and well being; and in coordination of care. SEE ABOVE FOR DETAILS  Meds ordered this encounter  Medications  . Cholecalciferol (VITAMIN D3) 2000 units TABS    Sig: Take 2,000 BAU/hr by mouth daily. 2000 IUs vitamin D3 per day    Dispense:  30 tablet     Return in about 4 months (around 12/20/2017) for f/up mood, weight loss/ health habits.   Anticipatory guidance and routine counseling done re: condition, txmnt options and need for follow up. All questions of patient's were  answered.   Gross side effects, risk and benefits, and alternatives of medications discussed with patient.  Patient is aware that all medications have potential side effects and we are unable to predict every sideeffect or drug-drug interaction that may occur.  Expresses verbal understanding and consents to current therapy plan and treatment regiment.  Please see AVS handed out to patient at the end  of our visit for additional patient instructions/ counseling done pertaining to today's office visit.  Note: This document was prepared using Dragon voice recognition software and may include unintentional dictation errors.    This document serves as a record of services personally performed by Mellody Dance, DO. It was created on her behalf by Toni Amend, a trained medical scribe. The creation of this record is based on the scribe's personal observations and the provider's statements to them.   I have reviewed the above medical documentation for accuracy and completeness and I concur.  Mellody Dance 09/04/17 6:45 PM   ----------------------------------------------------------------------------------------------------------------------  Subjective:    CC:   [name] is a 57 y.o. female who presents to [facility] today for review and discussion of recent bloodwork that was done.  1. All recent blood work that we ordered was reviewed with patient today.  Patient was counseled on all abnormalities and we discussed dietary and lifestyle changes that could help those values (also medications when appropriate).  Extensive health counseling performed and all patient's concerns/ questions were addressed.   Mood She started on the Prozac on January 11th 2019, 18 days ago, at one full 10 mg capsule a day.  She was given a supply of 15 10 mg capsules, and took all of those.  Yesterday or the day before, she started on the 20 mg.  She notes that she is not feeling any different yet, but her husband says that he thinks she is happier.  She notes that she is thinking irritating thoughts, but not saying them out loud.  Weight/Body Image Issues Her appetite remains intact.  She still wants to talk about her weight because she's still horribly unhappy with her number on the scale.  She feels like if she loses 15 pounds, she will "go back to being happy."   Her husband doesn't want her to lose 15 lbs because he thinks she looks better with the additional weight, but he "freely admits that she was happy-go-lucky" back when she weighed less.  Patient does not currently track her food intake.  GI Concerns She was having blood in her stool ten years ago due to an endometrioma in her colon.  She has been having significant bleeding again recently, beginning over the past four or five days, and worsening two days ago.  She has an appointment scheduled with Dr. Watt Climes for follow-up in the middle of March.    Wt Readings from Last 3 Encounters:  08/22/17 134 lb (60.8 kg)  08/04/17 134 lb (60.8 kg)  05/26/17 130 lb 6.4 oz (59.1 kg)   BP Readings from Last 3 Encounters:  08/22/17 119/80  08/04/17 120/84  05/26/17 116/70   Pulse Readings from Last 3 Encounters:  08/22/17 80  08/04/17 76  05/26/17 78   BMI Readings from Last 3 Encounters:  08/22/17 24.51 kg/m  08/04/17 24.51 kg/m  05/26/17 23.85 kg/m     Patient Care Team    Relationship Specialty Notifications Start End  Mellody Dance, DO PCP - General Family Medicine  05/26/17   Einar Gip,  Ulice Dash, MD Consulting Physician Cardiology  08/04/17   Juanito Doom, MD Consulting Physician Pulmonary Disease  08/04/17   Ninetta Lights, MD Consulting Physician Orthopedic Surgery  08/04/17   Anastasio Auerbach, MD Consulting Physician Gynecology  08/04/17   Gavin Pound, MD Consulting Physician Rheumatology  08/04/17   Rolm Bookbinder, MD Consulting Physician Dermatology  08/04/17    Comment: Eilene Ghazi, MD Referring Physician Orthopedic Surgery  08/04/17    Comment: spine center  Clarene Essex, MD Consulting Physician Gastroenterology  08/04/17    Comment: Colonoscopy  Regal, Tamala Fothergill, DPM Consulting Physician Podiatry  08/04/17     Full medical history updated and reviewed in the office today  Patient Active Problem List   Diagnosis Date Noted  . Mood disorder ([place])  09/04/2017    Priority: High  . Postmenopausal disorder 08/04/2017    Priority: High  . Body image disorder 08/04/2017    Priority: High  . History of DVT (deep vein thrombosis) 08/04/2017    Priority: Medium  . Spinal stenosis, lumbar- chronic back pain; Dr Sherlyn Lick 08/04/2017    Priority: Medium  . Acute deep vein thrombosis (DVT) of femoral vein of left lower extremity ([place]) 08/04/2017    Priority: Medium  . h/o DVT (deep venous thrombosis) ([place]) 09/04/2017  . Vitamin D insufficiency 09/04/2017  . Blood in stool 09/04/2017  . Clotting disorder ([place]) 08/05/2017  . Degenerative joint disease involving multiple joints 08/05/2017  . Fatigue 08/04/2017  . Chronic back pain greater than 3 months duration 08/04/2017  . H/O total hysterectomy 08/04/2017  . Exercise involving running- 22mi/ day 08/04/2017  . Family history of depression 08/04/2017  . Perimenopausal vasomotor symptoms-  sweating terribly  (mood changes as well) 08/04/2017  . History of excessive sweating - menopausal 08/04/2017  . h/o Pulmonary embolism ([place]) 05/26/2017    Past Medical History:  Diagnosis Date  . DVT (deep venous thrombosis) (HCC)    Right leg  . Pulmonary embolus ([place]) 08/2016    Past Surgical History:  Procedure Laterality Date  . LAPAROSCOPIC SUPRACERVICAL HYSTERECTOMY  2007   Leiomyoma  . ROTATOR CUFF REPAIR    . thumb surg    . TUBAL LIGATION      Social History   Tobacco Use  . Smoking status: Never Smoker  . Smokeless tobacco: Never Used  Substance Use Topics  . Alcohol use: Yes    Alcohol/week: 4.8 oz    Types: 8 Standard drinks or equivalent per week    Family Hx: Family History  Problem Relation Age of Onset  . COPD Mother   . Cancer Father        Throat,Prostate  . Heart disease Brother      Medications: Current Outpatient Medications  Medication Sig Dispense Refill  . beta carotene 10000 UNIT capsule Take 50,000 Units by mouth daily.    . Biotin 10000 MCG TBDP  Take 10,000 mcg by mouth daily.    . Black Cohosh 540 MG CAPS Take 1 capsule by mouth daily.    Marland Kitchen FLUoxetine (PROZAC) 20 MG tablet Take 1 tablet (20 mg total) by mouth daily. 30 tablet 1  . gabapentin (NEURONTIN) 300 MG capsule Take 300 mg by mouth daily.    Marland Kitchen loratadine (CLARITIN) 10 MG tablet Take 10 mg by mouth daily.    . Multiple Vitamin (MULTIVITAMIN) tablet Take 1 tablet by mouth daily.    Marland Kitchen RASPBERRY KETONES PO Take 500 mg by mouth daily.    Marland Kitchen  rivaroxaban (XARELTO) 20 MG TABS tablet Take 1 tablet (20 mg total) by mouth daily with supper. 30 tablet 5  . Turmeric 500 MG TABS Take 1 tablet by mouth daily.    . Cholecalciferol (VITAMIN D3) 2000 units TABS Take 2,000 BAU/hr by mouth daily. 2000 IUs vitamin D3 per day 30 tablet    No current facility-administered medications for this visit.     Allergies:  Allergies  Allergen Reactions  . Vicodin [Hydrocodone-Acetaminophen] Nausea And Vomiting     Review of Systems: General:   No F/C, wt loss Pulm:   No DIB, SOB, pleuritic chest pain Card:  No CP, palpitations Abd:  No n/v/d or pain Ext:  No inc edema from baseline  Objective:  Blood pressure 119/80, pulse 80, height 5\' 2"  (1.575 m), weight 134 lb (60.8 kg), SpO2 98 %. Body mass index is 24.51 kg/m. Gen:   Well NAD, A and O *3 HEENT:    [place]/AT, EOMI,  MMM Lungs:   Normal work of breathing. CTA B/L, no Wh, rhonchi Heart:   RRR, S1, S2 WNL's, no MRG Abd:   No gross distention Exts:    warm, pink,  Brisk capillary refill, warm and well perfused.  Psych:    No HI/SI, judgement and insight good, Euthymic mood. Full Affect.   Recent Results (from the past 2160 hour(s))  VITAMIN D 25 Hydroxy (Vit-D Deficiency, Fractures)     Status: None   Collection Time: 08/10/17  9:56 AM  Result Value Ref Range   Vit D, 25-Hydroxy 43.1 30.0 - 100.0 ng/mL    Comment: Vitamin D deficiency has been defined by the [place] practice guideline as a level of  serum 25-OH vitamin D less than 20 ng/mL (1,2). The Endocrine Society went on to further define vitamin D insufficiency as a level between 21 and 29 ng/mL (2). 1. IOM (Institute of Medicine). 2010. Dietary reference    intakes for calcium and D. [place]: The    [employer]. 2. Holick MF, [place], Bischoff-Ferrari HA, et al.    Evaluation, treatment, and prevention of vitamin D    deficiency: an Endocrine Society clinical practice    guideline. JCEM. 2011 Jul; 96(7):1911-30.   Vitamin B12     Status: None   Collection Time: 08/10/17  9:56 AM  Result Value Ref Range   Vitamin B-12 387 232 - 1,245 pg/mL  TSH     Status: None   Collection Time: 08/10/17  9:56 AM  Result Value Ref Range   TSH 2.020 0.450 - 4.500 uIU/mL  T4, free     Status: None   Collection Time: 08/10/17  9:56 AM  Result Value Ref Range   Free T4 1.07 0.82 - 1.77 ng/dL  T3, free     Status: None   Collection Time: 08/10/17  9:56 AM  Result Value Ref Range   T3, Free 3.9 2.0 - 4.4 pg/mL  Lipid panel     Status: None   Collection Time: 08/10/17  9:56 AM  Result Value Ref Range   Cholesterol, Total 188 100 - 199 mg/dL   Triglycerides 99 0 - 149 mg/dL   HDL 70 >39 mg/dL   VLDL Cholesterol Cal 20 5 - 40 mg/dL   LDL Calculated 98 0 - 99 mg/dL   Chol/HDL Ratio 2.7 0.0 - 4.4 ratio    Comment:  T. Chol/HDL Ratio                                             Men  Women                               1/2 Avg.Risk  3.4    3.3                                   Avg.Risk  5.0    4.4                                2X Avg.Risk  9.6    7.1                                3X Avg.Risk 23.4   11.0   Hemoglobin A1c     Status: None   Collection Time: 08/10/17  9:56 AM  Result Value Ref Range   Hgb A1c MFr Bld 5.3 4.8 - 5.6 %    Comment:          Prediabetes: 5.7 - 6.4          Diabetes: >6.4          Glycemic control for adults with diabetes: <7.0    Est. average glucose  Bld gHb Est-mCnc 105 mg/dL  Comprehensive metabolic panel     Status: Abnormal   Collection Time: 08/10/17  9:56 AM  Result Value Ref Range   Glucose 87 65 - 99 mg/dL   BUN 16 6 - 24 mg/dL   Creatinine, Ser 0.68 0.57 - 1.00 mg/dL   GFR calc non Af Amer 99 >59 mL/min/1.73   GFR calc Af Amer 115 >59 mL/min/1.73   BUN/Creatinine Ratio 24 (H) 9 - 23   Sodium 142 134 - 144 mmol/L   Potassium 4.7 3.5 - 5.2 mmol/L   Chloride 104 96 - 106 mmol/L   CO2 24 20 - 29 mmol/L   Calcium 10.2 8.7 - 10.2 mg/dL   Total Protein 6.9 6.0 - 8.5 g/dL   Albumin 4.3 3.5 - 5.5 g/dL   Globulin, Total 2.6 1.5 - 4.5 g/dL   Albumin/Globulin Ratio 1.7 1.2 - 2.2   Bilirubin Total 0.9 0.0 - 1.2 mg/dL   Alkaline Phosphatase 78 39 - 117 IU/L   AST 23 0 - 40 IU/L   ALT 18 0 - 32 IU/L  CBC with Differential/Platelet     Status: None   Collection Time: 08/10/17  9:56 AM  Result Value Ref Range   WBC 5.2 3.4 - 10.8 x10E3/uL   RBC 4.61 3.77 - 5.28 x10E6/uL   Hemoglobin 14.6 11.1 - 15.9 g/dL   Hematocrit 43.7 34.0 - 46.6 %   MCV 95 79 - 97 fL   MCH 31.7 26.6 - 33.0 pg   MCHC 33.4 31.5 - 35.7 g/dL   RDW 12.5 12.3 - 15.4 %   Platelets 275 150 - 379 x10E3/uL   Neutrophils 51 Not Estab. %   Lymphs 39 Not Estab. %   Monocytes 8 Not Estab. %   Eos 2 Not  Estab. %   Basos 0 Not Estab. %   Neutrophils Absolute 2.6 1.4 - 7.0 x10E3/uL   Lymphocytes Absolute 2.0 0.7 - 3.1 x10E3/uL   Monocytes Absolute 0.4 0.1 - 0.9 x10E3/uL   EOS (ABSOLUTE) 0.1 0.0 - 0.4 x10E3/uL   Basophils Absolute 0.0 0.0 - 0.2 x10E3/uL   Immature Granulocytes 0 Not Estab. %   Immature Grans (Abs) 0.0 0.0 - 0.1 x10E3/uL

## 2017-08-26 IMAGING — DX DG CHEST 2V
2 series · 2 of 2 positions shown · non-contrast
Comparison: Chest x-ray of June 20, 2016

CLINICAL DATA: Shortness of breath, exam performed prior to
ventilation-perfusion lung scan.

EXAM:
CHEST  2 VIEW

[chest pa]
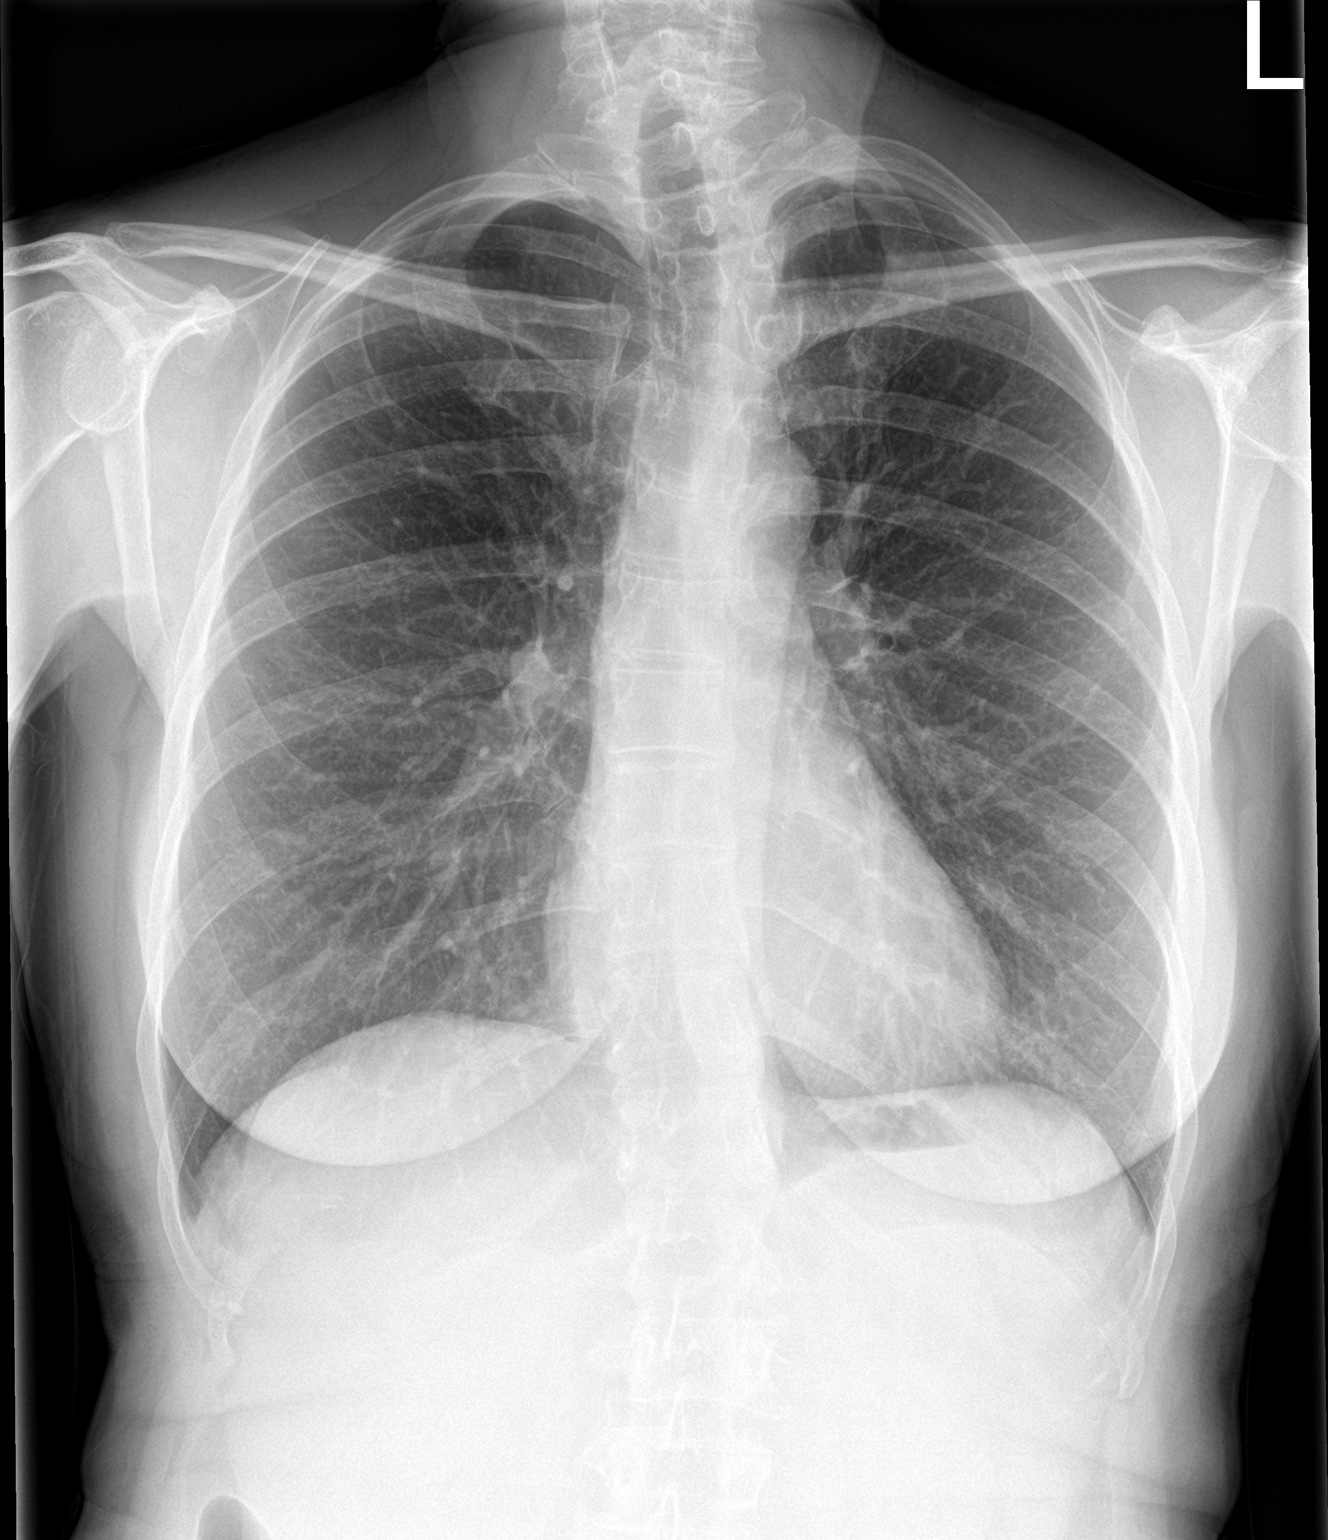

[chest lat]
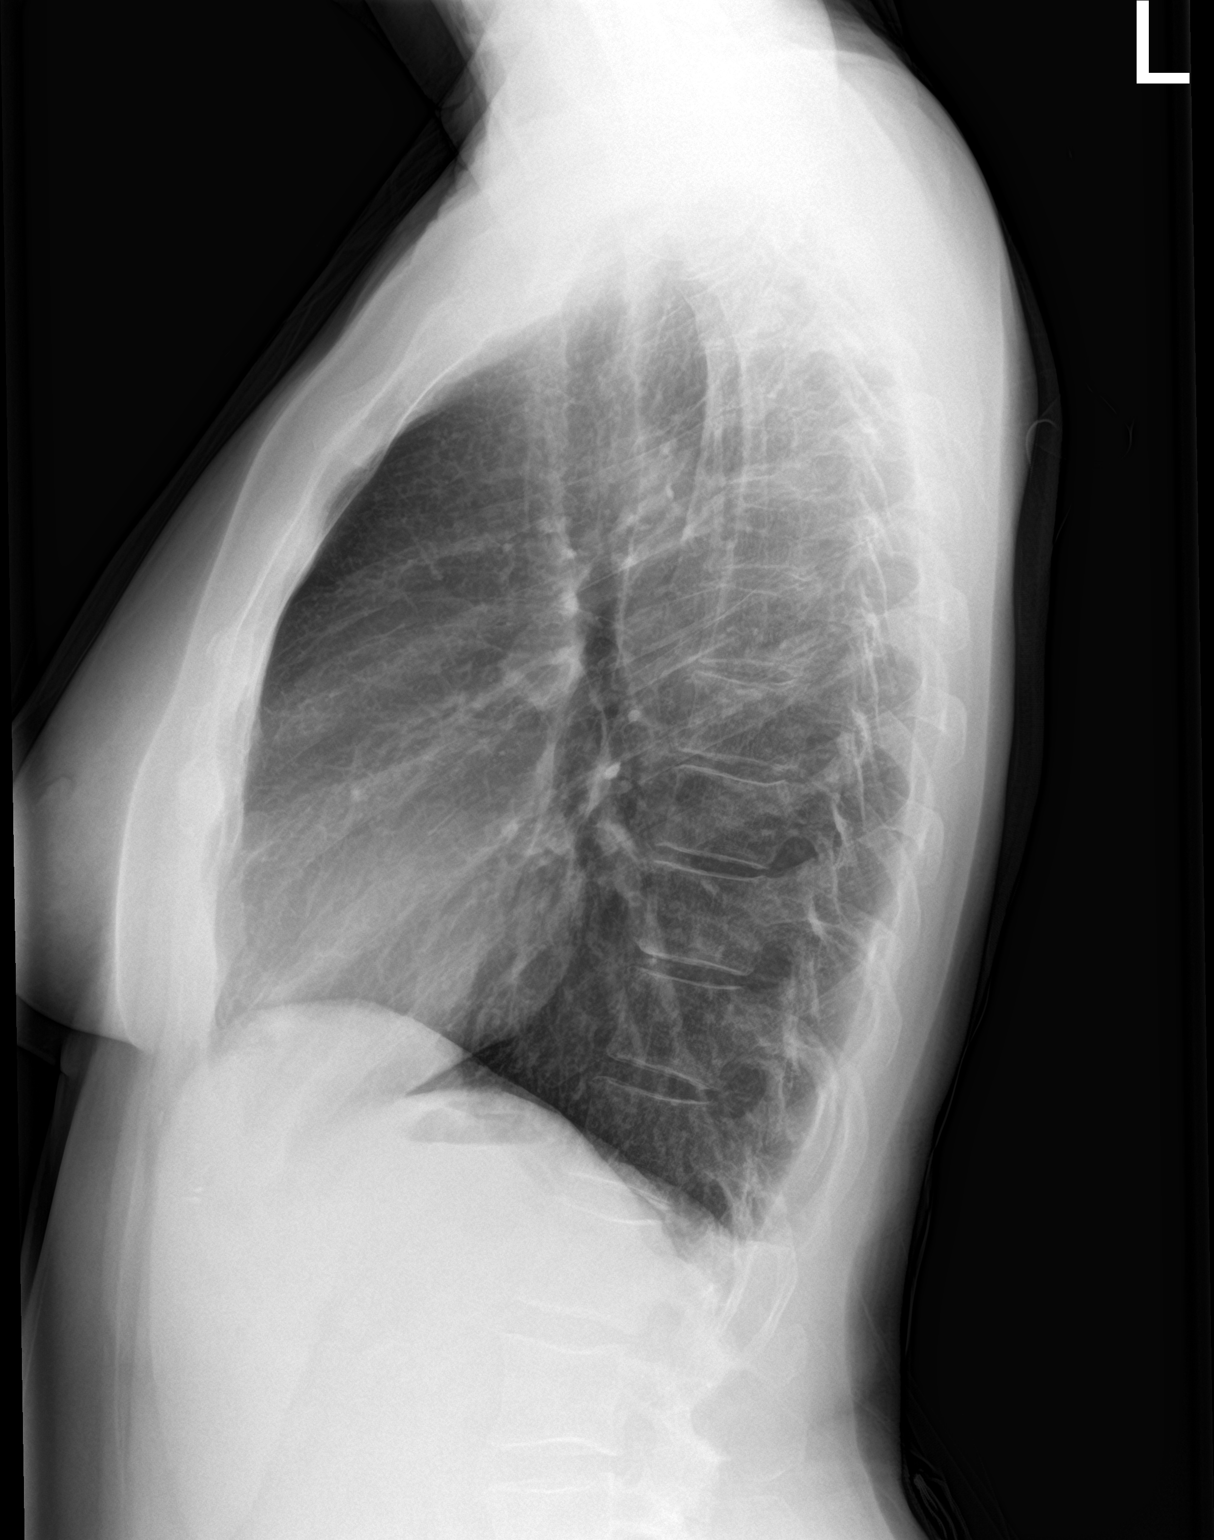

[2 of 2 positions shown; findings below may reference images not displayed]

FINDINGS: The lungs are well-expanded and clear. The heart and pulmonary
vascularity are normal. The mediastinum is normal in width. There is
chronic blunting of the posterior costophrenic angles. The bony
thorax exhibits no acute abnormality. There is mild S shaped
thoracolumbar curvature.
IMPRESSION: There is no acute cardiopulmonary abnormality.

## 2017-09-04 DIAGNOSIS — I82409 Acute embolism and thrombosis of unspecified deep veins of unspecified lower extremity: Secondary | ICD-10-CM | POA: Insufficient documentation

## 2017-09-04 DIAGNOSIS — F39 Unspecified mood [affective] disorder: Secondary | ICD-10-CM | POA: Insufficient documentation

## 2017-09-04 DIAGNOSIS — K921 Melena: Secondary | ICD-10-CM | POA: Insufficient documentation

## 2017-09-04 DIAGNOSIS — E559 Vitamin D deficiency, unspecified: Secondary | ICD-10-CM | POA: Insufficient documentation

## 2017-09-08 DIAGNOSIS — Z8371 Family history of colonic polyps: Secondary | ICD-10-CM | POA: Diagnosis not present

## 2017-09-08 DIAGNOSIS — K921 Melena: Secondary | ICD-10-CM | POA: Diagnosis not present

## 2017-09-11 ENCOUNTER — Other Ambulatory Visit: Payer: Self-pay | Admitting: Gastroenterology

## 2017-09-28 DIAGNOSIS — Z9889 Other specified postprocedural states: Secondary | ICD-10-CM | POA: Diagnosis not present

## 2017-09-28 DIAGNOSIS — K921 Melena: Secondary | ICD-10-CM | POA: Diagnosis not present

## 2017-09-28 DIAGNOSIS — Z8371 Family history of colonic polyps: Secondary | ICD-10-CM | POA: Diagnosis not present

## 2017-10-02 LAB — HM COLONOSCOPY

## 2017-10-04 ENCOUNTER — Ambulatory Visit: Payer: 59 | Admitting: Pulmonary Disease

## 2017-10-06 ENCOUNTER — Encounter: Payer: Self-pay | Admitting: Pulmonary Disease

## 2017-10-06 ENCOUNTER — Ambulatory Visit: Payer: 59 | Admitting: Pulmonary Disease

## 2017-10-06 VITALS — BP 122/68 | HR 85 | Ht 62.0 in | Wt 138.0 lb

## 2017-10-06 DIAGNOSIS — Z86711 Personal history of pulmonary embolism: Secondary | ICD-10-CM

## 2017-10-06 MED ORDER — RIVAROXABAN 10 MG PO TABS: 10.0000 mg | ORAL_TABLET | Freq: Every day | ORAL | 5 refills | Status: DC

## 2017-10-06 NOTE — Progress Notes (Signed)
Subjective:    Patient ID: Julie Harding, female    DOB: 1961-01-19, 57 y.o.   MRN: [id number]  Synopsis: referred in 2018 for evaluation of PE.   HPI Chief Complaint  Patient presents with  . Follow-up    pt doing well, no complaints currently.    [name] is doing well.  She says that she doesn't quite fel like she is back to normal.  She says taht she thinks the problem is complicated and she is suffering from some depression and she is going through menopause.  She doesn't have difficulty breathing.  She has been taking gabapentin which helps with her back pain quite a bit.  She is running 7 miles a day.  She is often on the treadmill or stationary bike.   Past Medical History:  Diagnosis Date  . DVT (deep venous thrombosis) (HCC)    Right leg  . Pulmonary embolus ([place]) 08/2016        Review of Systems  Constitutional: Negative for fever and unexpected weight change.  HENT: Negative for congestion, dental problem, ear pain, nosebleeds, postnasal drip, rhinorrhea, sinus pressure, sneezing, sore throat and trouble swallowing.   Eyes: Negative for redness and itching.  Respiratory: Positive for shortness of breath. Negative for cough, chest tightness and wheezing.   Cardiovascular: Negative for palpitations and leg swelling.  Gastrointestinal: Negative for nausea and vomiting.  Genitourinary: Negative for dysuria.  Musculoskeletal: Negative for joint swelling.  Skin: Negative for rash.  Neurological: Negative for headaches.  Hematological: Does not bruise/bleed easily.  Psychiatric/Behavioral: Negative for dysphoric mood. The patient is not nervous/anxious.        Objective:   Physical Exam Vitals:   10/06/17 0928  BP: 122/68  Pulse: 85  SpO2: 99%  Weight: 138 lb (62.6 kg)  Height: 5\' 2"  (1.575 m)   RA   Gen: well appearing HENT: OP clear, TM's clear, neck supple PULM: CTA B, normal percussion CV: RRR, no mgr, trace edema GI: BS+, soft, nontender Derm:  no cyanosis or rash Psyche: normal mood and affect    PFT April 18, 2017 showed FEV1 at 105%, ratio 89, FVC 93% no significant bronchodilator response.  DLCO 108%.  Echocardiogram: June 2018 LVEF 55%, normal left atrium, mild tricuspid regurgitation with no evidence of pulmonary hypertension, PA systolic pressure 26 mmHg, IVC dilated  Chest imaging: June 2018 VQ scan no evidence of pulmonary embolism  February 2018 CT chest images independently reviewed showing low lung volumes, basilar atelectasis and groundglass but no obvious pulmonary parenchymal abnormality otherwise, submassive pulmonary embolism noted with RV dilation and bilateral PE     Assessment & Plan:   History of pulmonary embolus (PE)  Discussion: This has been a stable interval for Ms. Lunney.  She has not had any problems with the Xarelto.  Now that she is more than 6 months out from the last blood clot we can safely decrease the dose of Xarelto to 10 mg daily.  There is data that shows [place] that we can use half the dose of an anticoagulant for patients need lifelong anticoagulation and still prevent DVT and PE successfully while minimizing bleeding risk.  Plan: Recurrent deep vein thrombosis: Decrease Xarelto to 10 mg daily You will need to take Xarelto or some form of anticoagulant agent for life As you know this does minimally increase her risk for bleeding but by decreasing the dose from 20 mg to 10 mg will help lower this risk Follow-up with your primary  care physician or me if necessary      Current Outpatient Medications:  .  beta carotene 10000 UNIT capsule, Take 50,000 Units by mouth daily., Disp: , Rfl:  .  Biotin 10000 MCG TBDP, Take 10,000 mcg by mouth daily., Disp: , Rfl:  .  Black Cohosh 540 MG CAPS, Take 1 capsule by mouth daily., Disp: , Rfl:  .  Cholecalciferol (VITAMIN D3) 2000 units TABS, Take 2,000 BAU/hr by mouth daily. 2000 IUs vitamin D3 per day, Disp: 30 tablet, Rfl:  .  FLUoxetine  (PROZAC) 20 MG tablet, Take 1 tablet (20 mg total) by mouth daily., Disp: 30 tablet, Rfl: 1 .  gabapentin (NEURONTIN) 300 MG capsule, Take 300 mg by mouth daily., Disp: , Rfl:  .  loratadine (CLARITIN) 10 MG tablet, Take 10 mg by mouth daily., Disp: , Rfl:  .  Multiple Vitamin (MULTIVITAMIN) tablet, Take 1 tablet by mouth daily., Disp: , Rfl:  .  Probiotic Product (PROBIOTIC-10 PO), Take 1 capsule by mouth 2 (two) times daily., Disp: , Rfl:  .  RASPBERRY KETONES PO, Take 500 mg by mouth daily., Disp: , Rfl:  .  rivaroxaban (XARELTO) 20 MG TABS tablet, Take 1 tablet (20 mg total) by mouth daily with supper., Disp: 30 tablet, Rfl: 5 .  Turmeric 500 MG TABS, Take 1 tablet by mouth daily., Disp: , Rfl:

## 2017-10-06 NOTE — Patient Instructions (Signed)
Recurrent deep vein thrombosis: Decrease Xarelto to 10 mg daily You will need to take Xarelto or some form of anticoagulant agent for life As you know this does minimally increase her risk for bleeding but by decreasing the dose from 20 mg to 10 mg will help lower this risk Follow-up with your primary care physician or me if necessary

## 2017-10-06 NOTE — Addendum Note (Signed)
Addended by: Len Blalock on: 10/06/2017 10:11 AM   Modules accepted: Orders

## 2017-10-12 ENCOUNTER — Other Ambulatory Visit: Payer: Self-pay | Admitting: Family Medicine

## 2017-10-16 ENCOUNTER — Encounter: Payer: Self-pay | Admitting: Obstetrics & Gynecology

## 2017-10-16 ENCOUNTER — Ambulatory Visit: Payer: 59 | Admitting: Obstetrics & Gynecology

## 2017-10-16 VITALS — BP 120/80 | HR 75 | Ht 62.0 in | Wt 140.1 lb

## 2017-10-16 DIAGNOSIS — Z01419 Encounter for gynecological examination (general) (routine) without abnormal findings: Secondary | ICD-10-CM

## 2017-10-16 DIAGNOSIS — Z1151 Encounter for screening for human papillomavirus (HPV): Secondary | ICD-10-CM

## 2017-10-16 DIAGNOSIS — Z124 Encounter for screening for malignant neoplasm of cervix: Secondary | ICD-10-CM

## 2017-10-16 MED ORDER — FLUOXETINE HCL 10 MG PO TABS: 10.0000 mg | ORAL_TABLET | Freq: Every day | ORAL | 3 refills | Status: DC

## 2017-10-16 MED ORDER — LEVOTHYROXINE SODIUM 25 MCG PO TABS: 25.0000 ug | ORAL_TABLET | Freq: Every day | ORAL | 12 refills | Status: DC

## 2017-10-16 NOTE — Progress Notes (Signed)
Subjective:    Julie Harding is a 57 y.o. married P0 female who presents for an annual exam. She has a Economist of complaints. Her main issue is menopausal symptoms---She has gained weight, was started on prozac for "body image dysphoria" per a [place] doctor. The patient really doesn't feel like she has that.  The hot flashes have gotten better. The patient is sexually active. GYN screening history: last pap: was normal. The patient wears seatbelts: yes. The patient participates in regular exercise: yes. Has the patient ever been transfused or tattooed?: no. The patient reports that there is not domestic violence in her life.   Menstrual History: OB History    Gravida  0   Para  0   Term  0   Preterm  0   AB  0   Living  0     SAB  0   TAB  0   Ectopic  0   Multiple  0   Live Births  0           Menarche age: 75 No LMP recorded. Patient has had a hysterectomy.    The following portions of the patient's history were reviewed and updated as appropriate: allergies, current medications, past family history, past medical history, past social history, past surgical history and problem list.  Review of Systems Pertinent items are noted in HPI.   FH- + breast cancer- maternal aunt and her 2 daughters Jogs 32 miles per week Unemployed by choice    Objective:    BP 120/80 (BP Location: Right Arm, Patient Position: Sitting, Cuff Size: Normal)   Pulse 75   Ht 5\' 2"  (1.575 m)   Wt 140 lb 1.6 oz (63.5 kg)   BMI 25.62 kg/m   General Appearance:    Alert, cooperative, no distress, appears stated age  Head:    Normocephalic, without obvious abnormality, atraumatic  Eyes:    PERRL, conjunctiva/corneas clear, EOM's intact, fundi    benign, both eyes  Ears:    Normal TM's and external ear canals, both ears  Nose:   Nares normal, septum midline, mucosa normal, no drainage    or sinus tenderness  Throat:   Lips, mucosa, and tongue normal; teeth and gums normal  Neck:    Supple, symmetrical, trachea midline, no adenopathy;    thyroid:  no enlargement/tenderness/nodules; no carotid   bruit or JVD  Back:     Symmetric, no curvature, ROM normal, no CVA tenderness  Lungs:     Clear to auscultation bilaterally, respirations unlabored  Chest Wall:    No tenderness or deformity   Heart:    Regular rate and rhythm, S1 and S2 normal, no murmur, rub   or gallop  Breast Exam:    No tenderness, masses, or nipple abnormality  Abdomen:     Soft, non-tender, bowel sounds active all four quadrants,    no masses, no organomegaly  Genitalia:    Normal female without lesion, discharge or tenderness, moderate vulvovaginal atrophy, stenotic cervix, no masses or tenderness with bimanual exam     Extremities:   Extremities normal, atraumatic, no cyanosis or edema  Pulses:   2+ and symmetric all extremities  Skin:   Skin color, texture, turgor normal, no rashes or lesions  Lymph nodes:   Cervical, supraclavicular, and axillary nodes normal  Neurologic:   CNII-XII intact, normal strength, sensation and reflexes    throughout  .    Assessment:    Healthy female  exam.    Plan:     Thin prep Pap smear.  with cotesting (She had a supracervical hysterectomy) Rec [facility] today Wean off of prozac Come back in 4 weeks Decreased metabolism- use a little synthroid 11mcg daily

## 2017-10-18 LAB — CYTOLOGY - PAP: HPV: NOT DETECTED

## 2017-10-30 ENCOUNTER — Other Ambulatory Visit: Payer: Self-pay | Admitting: Gastroenterology

## 2017-10-30 DIAGNOSIS — K921 Melena: Secondary | ICD-10-CM | POA: Diagnosis not present

## 2017-11-16 ENCOUNTER — Ambulatory Visit: Payer: 59 | Admitting: Obstetrics & Gynecology

## 2017-11-16 ENCOUNTER — Telehealth: Payer: Self-pay | Admitting: Radiology

## 2017-11-16 VITALS — BP 125/80 | Wt 140.0 lb

## 2017-11-16 DIAGNOSIS — Z0000 Encounter for general adult medical examination without abnormal findings: Secondary | ICD-10-CM

## 2017-11-16 DIAGNOSIS — Z803 Family history of malignant neoplasm of breast: Secondary | ICD-10-CM | POA: Diagnosis not present

## 2017-11-16 NOTE — Telephone Encounter (Signed)
Left message to call [facility] to confirm why she is requesting to be referred to Dr Dwana Melena.

## 2017-11-16 NOTE — Progress Notes (Signed)
   Subjective:    Patient ID: Julie Harding, female    DOB: 1960-10-23, 57 y.o.   MRN: [id number]  HPI 57 yo married P0 here for follow up after starting synthroid at 25 mcg daily. She has not noticed any improvements. She would like to see a doctor about weight loss assistance.   Review of Systems     Objective:   Physical Exam Breathing, conversing, and ambulating normally Well nourished, well hydrated White female, no apparent distress      Assessment & Plan:  Strong FH of cancer- check [facility]- schedule mammogram Rec that she see Dr. Sharyn Dross

## 2017-11-17 ENCOUNTER — Other Ambulatory Visit: Payer: Self-pay | Admitting: Gastroenterology

## 2017-11-20 ENCOUNTER — Other Ambulatory Visit: Payer: Self-pay

## 2017-11-20 ENCOUNTER — Encounter (HOSPITAL_COMMUNITY)
Admission: RE | Disposition: A | Discharge status: Home / Self Care | Payer: Self-pay | Source: Ambulatory Visit | Attending: Gastroenterology

## 2017-11-20 ENCOUNTER — Ambulatory Visit (HOSPITAL_COMMUNITY)
Admission: RE | Admit: 2017-11-20 | Discharge: 2017-11-20 | Disposition: A | Discharge status: Home / Self Care | Payer: 59 | Source: Ambulatory Visit | Attending: Gastroenterology | Admitting: Gastroenterology

## 2017-11-20 ENCOUNTER — Encounter (HOSPITAL_COMMUNITY): Payer: Self-pay | Admitting: *Deleted

## 2017-11-20 DIAGNOSIS — N805 Endometriosis of intestine: Secondary | ICD-10-CM | POA: Insufficient documentation

## 2017-11-20 DIAGNOSIS — K639 Disease of intestine, unspecified: Secondary | ICD-10-CM | POA: Insufficient documentation

## 2017-11-20 DIAGNOSIS — Z800 Family history of malignant neoplasm of digestive organs: Secondary | ICD-10-CM | POA: Diagnosis not present

## 2017-11-20 DIAGNOSIS — K921 Melena: Secondary | ICD-10-CM | POA: Diagnosis not present

## 2017-11-20 HISTORY — PX: FLEXIBLE SIGMOIDOSCOPY: SHX5431

## 2017-11-20 HISTORY — PX: HOT HEMOSTASIS: SHX5433

## 2017-11-20 SURGERY — SIGMOIDOSCOPY, FLEXIBLE
Anesthesia: Moderate Sedation

## 2017-11-20 MED ORDER — MIDAZOLAM HCL 5 MG/ML IJ SOLN
INTRAMUSCULAR | Status: AC
  Filled 2017-11-20: qty 2

## 2017-11-20 MED ORDER — SODIUM CHLORIDE 0.9 % IV SOLN
INTRAVENOUS | Status: DC
  Administered 2017-11-20: 500 mL via INTRAVENOUS

## 2017-11-20 MED ORDER — SODIUM CHLORIDE 0.9 % IV SOLN: INTRAVENOUS | Status: DC

## 2017-11-20 MED ORDER — MIDAZOLAM HCL 10 MG/2ML IJ SOLN
INTRAMUSCULAR | Status: DC | PRN
  Administered 2017-11-20: 2 mg via INTRAVENOUS
  Administered 2017-11-20: 1 mg via INTRAVENOUS
  Administered 2017-11-20: 2 mg via INTRAVENOUS

## 2017-11-20 MED ORDER — FENTANYL CITRATE (PF) 100 MCG/2ML IJ SOLN
INTRAMUSCULAR | Status: DC | PRN
  Administered 2017-11-20 (×2): 25 ug via INTRAVENOUS

## 2017-11-20 MED ORDER — DIPHENHYDRAMINE HCL 50 MG/ML IJ SOLN
INTRAMUSCULAR | Status: AC
  Filled 2017-11-20: qty 1

## 2017-11-20 MED ORDER — FENTANYL CITRATE (PF) 100 MCG/2ML IJ SOLN
INTRAMUSCULAR | Status: AC
  Filled 2017-11-20: qty 2

## 2017-11-20 NOTE — Discharge Instructions (Signed)
Call if question or problem otherwise may have soft solids for lunch and slowly advance diet and may resume blood thinner either later today or tomorrow and less signs of obvious bleeding  YOU HAD AN ENDOSCOPIC PROCEDURE TODAY: Refer to the procedure report and other information in the discharge instructions given to you for any specific questions about what was found during the examination. If this information does not answer your questions, please call [facility] office at [phone] to clarify.   YOU SHOULD EXPECT: Some feelings of bloating in the abdomen. Passage of more gas than usual. Walking can help get rid of the air that was put into your GI tract during the procedure and reduce the bloating. If you had a lower endoscopy (such as a colonoscopy or flexible sigmoidoscopy) you may notice spotting of blood in your stool or on the toilet paper. Some abdominal soreness may be present for a day or two, also.  DIET: Your first meal following the procedure should be a light meal and then it is ok to progress to your normal diet. A half-sandwich or bowl of soup is an example of a good first meal. Heavy or fried foods are harder to digest and may make you feel nauseous or bloated. Drink plenty of fluids but you should avoid alcoholic beverages for 24 hours. If you had a esophageal dilation, please see attached instructions for diet.   ACTIVITY: Your care partner should take you home directly after the procedure. You should plan to take it easy, moving slowly for the rest of the day. You can resume normal activity the day after the procedure however YOU SHOULD NOT DRIVE, use power tools, machinery or perform tasks that involve climbing or major physical exertion for 24 hours (because of the sedation medicines used during the test).   SYMPTOMS TO REPORT IMMEDIATELY: A gastroenterologist can be reached at any hour. Please call [phone]  for any of the following symptoms:   Following lower endoscopy  (colonoscopy, flexible sigmoidoscopy) Excessive amounts of blood in the stool  Significant tenderness, worsening of abdominal pains  Swelling of the abdomen that is new, acute  Fever of 100 or higher   FOLLOW UP:  If any biopsies were taken you will be contacted by phone or by letter within the next 1-3 weeks. Call [phone]  if you have not heard about the biopsies in 3 weeks.  Please also call with any specific questions about appointments or follow up tests.

## 2017-11-20 NOTE — Progress Notes (Signed)
Julie Harding 11:09 AM  Subjective: Patient with only 1 episode of bleeding since we last saw her and no new complaints  Objective: Vital signs stable afebrile exam please see preassessment evaluation  Assessment: Bright red blood per rectum thought secondary to scarring from previous surgery from endometriosis  Plan: Okay to proceed with flexible sigmoidoscopy with moderate sedation and possibleAPC of area versus RFA  [facility]  Pager [phone] After 5PM or if no answer call [phone]

## 2017-11-20 NOTE — Op Note (Signed)
[employer] Patient Name: Julie Harding Procedure Date: 11/20/2017 MRN: [id number] Attending MD: Clarene Essex , MD Date of Birth: 1961/07/16 CSN: [id number] Age: 57 Admit Type: Outpatient Procedure:                Flexible Sigmoidoscopy Indications:              Hematochezia, oozing inflammatory area by tattoo on                            recent colonoscopy Providers:                Clarene Essex, MD, Carmie End, RN, Cletis Athens,                            Technician Referring MD:              Medicines:                Fentanyl 50 micrograms IV, Midazolam 5 mg IV Complications:            No immediate complications. Estimated Blood Loss:     Estimated blood loss: none. Procedure:                Pre-Anesthesia Assessment:                           - Prior to the procedure, a History and Physical                            was performed, and patient medications and                            allergies were reviewed. The patient's tolerance of                            previous anesthesia was also reviewed. The risks                            and benefits of the procedure and the sedation                            options and risks were discussed with the patient.                            All questions were answered, and informed consent                            was obtained. Prior Anticoagulants: The patient has                            taken Xarelto (rivaroxaban), last dose was 4 days                            prior to procedure. ASA Grade Assessment: II - A  patient with mild systemic disease. After reviewing                            the risks and benefits, the patient was deemed in                            satisfactory condition to undergo the procedure.                           After obtaining informed consent, the scope was                            passed under direct vision. The EC-[id number] ([id number])       scope was introduced through the anus and advanced                            to the the sigmoid colon. The flexible                            sigmoidoscopy was accomplished without difficulty.                            The patient tolerated the procedure well. The                            quality of the bowel preparation was adequate. Scope In: Scope Out: Findings:      A tattoo was seen in the distal sigmoid colon. The tattoo site appeared       abnormal with a little residual inflammation and erythema. Fulguration       to ablate the lesion to prevent bleeding by argon plasma at 1       liter/minute and 20 watts was successful.      The exam was otherwise without abnormality in exam to the mid sigmoid Impression:               - A tattoo was seen in the distal sigmoid colon.                            The tattoo site appeared abnormal. Treated with                            argon plasma coagulation (APC).                           - The examination was otherwise normal.                           - No specimens collected. Moderate Sedation:      Moderate (conscious) sedation was administered by the endoscopy nurse       and supervised by the endoscopist. The following parameters were       monitored: oxygen saturation, heart rate, blood pressure, respiratory       rate, EKG, adequacy of pulmonary ventilation, and response to care. Recommendation:           -  Patient has a contact number available for                            emergencies. The signs and symptoms of potential                            delayed complications were discussed with the                            patient. Return to normal activities tomorrow.                            Written discharge instructions were provided to the                            patient.                           - Soft diet today slowly advance as tolerated.                           - Resume Xarelto (rivaroxaban) at prior dose  today                            or tomorrow.                           - Return to GI clinic PRN.                           - Telephone GI clinic if symptomatic PRN. Call with                            an update if doing well in a few months Procedure Code(s):        --- Professional ---                           [phone], Sigmoidoscopy, flexible; with control of                            bleeding, any method Diagnosis Code(s):        --- Professional ---                           K92.1, Melena (includes Hematochezia) CPT copyright 2017 American Medical Association. All rights reserved. The codes documented in this report are preliminary and upon coder review may  be revised to meet current compliance requirements. Clarene Essex, MD 11/20/2017 11:42:37 AM This report has been signed electronically. Number of Addenda: 0

## 2017-11-21 ENCOUNTER — Encounter (HOSPITAL_COMMUNITY): Payer: Self-pay | Admitting: Gastroenterology

## 2017-11-22 ENCOUNTER — Other Ambulatory Visit: Payer: Self-pay | Admitting: Obstetrics & Gynecology

## 2017-11-22 DIAGNOSIS — Z1231 Encounter for screening mammogram for malignant neoplasm of breast: Secondary | ICD-10-CM

## 2017-11-23 ENCOUNTER — Telehealth: Payer: Self-pay

## 2017-11-23 DIAGNOSIS — Z809 Family history of malignant neoplasm, unspecified: Secondary | ICD-10-CM

## 2017-11-23 NOTE — Telephone Encounter (Signed)
Patient was tested for generic testing with [facility]. Test came back positive for a uncertain gene. She will be referred to the cancer center for further workup. Patient is aware that she will be receiving a phone call.

## 2017-11-23 NOTE — Telephone Encounter (Deleted)
Patient has bv and yeast on pap. Will send in flagyl and diflucan for patient to take. Patient voice understanding. gb

## 2017-11-24 ENCOUNTER — Telehealth: Payer: Self-pay | Admitting: Genetic Counselor

## 2017-11-24 ENCOUNTER — Encounter: Payer: Self-pay | Admitting: Genetic Counselor

## 2017-11-24 NOTE — Telephone Encounter (Signed)
Genetic counseling appt has been scheduled for the pt to see  Steele Berg on 5/10 at 1230pm. Pt aware to arrive 30 minutes early. Letter mailed.

## 2017-11-27 NOTE — Telephone Encounter (Signed)
Patient has been scheduled for generic counseling.

## 2017-11-27 NOTE — Telephone Encounter (Signed)
Enter in error

## 2017-12-01 ENCOUNTER — Inpatient Hospital Stay: Payer: 59

## 2017-12-01 ENCOUNTER — Encounter: Payer: Self-pay | Admitting: Genetic Counselor

## 2017-12-01 ENCOUNTER — Inpatient Hospital Stay: Payer: 59 | Attending: Genetic Counselor | Admitting: Genetic Counselor

## 2017-12-01 DIAGNOSIS — Z800 Family history of malignant neoplasm of digestive organs: Secondary | ICD-10-CM

## 2017-12-01 DIAGNOSIS — Z1379 Encounter for other screening for genetic and chromosomal anomalies: Secondary | ICD-10-CM

## 2017-12-01 DIAGNOSIS — Z809 Family history of malignant neoplasm, unspecified: Secondary | ICD-10-CM | POA: Insufficient documentation

## 2017-12-01 DIAGNOSIS — Z803 Family history of malignant neoplasm of breast: Secondary | ICD-10-CM | POA: Diagnosis not present

## 2017-12-01 DIAGNOSIS — Z7183 Encounter for nonprocreative genetic counseling: Secondary | ICD-10-CM | POA: Diagnosis not present

## 2017-12-01 HISTORY — DX: Encounter for other screening for genetic and chromosomal anomalies: Z13.79

## 2017-12-01 NOTE — Progress Notes (Signed)
[facility]      Initial Visit   Patient Name: Julie Harding Patient DOB: May 04, 1961 Patient Age: 57 y.o. Encounter Date: 12/01/2017  Referring Provider: Clovia Cuff, MD  Primary Care Provider: Mellody Dance, DO  Reason for Visit: Evaluate for hereditary susceptibility to cancer    Assessment and Plan:  . Julie Harding's family history is not suggestive of a hereditary predisposition to cancer and her normal genetic testing results are reassuring.   . She understood that the variant of uncertain significance that was listed on her report from [facility] is not a mutation and there is nothing she nor her relatives need to do with it.  . No additional genetic testing was indicated at this time. We recommended Julie Harding continue to have a yearly mammogram, a yearly clinical breast exam, perform monthly breast self-exams and have a yearly gynecologic exam. Her next colonoscopy is to be scheduled in 2029.     . Julie Harding is encouraged to remain in contact with Cancer Genetics annually so that we can update the family history and inform her of any changes in cancer genetics and testing that may be of benefit for this family.     Dr. Jana Harding was available for questions concerning this case. Total time spent by me in face-to-face counseling was approximately 30 minutes.   _____________________________________________________________________   History of Present Illness: Ms. Julie Harding, a 57 y.o. female, is being seen at the [facility] due to a family history of cancer and to review results of recent genetic testing she had. She presents to clinic today with her husband.  Julie Harding has no personal history of cancer. She stated that she undergoes a yearly mammogram, clinical breast exam and gynecologic exam. Her most recent colonoscopy (09/2017) was negative for polyps and she was told to come back in 10 years.  Genetic testing was  ordered by Dr. Clovia Harding due to Julie Harding's family history. Results did not identify any mutations in the genes analyzed. Testing was through [facility] and analysis included 47 genes on [facility]'s Common Cancers panel (APC, ATM, AXIN2, BARD1, BMPR1A, BRCA1, BRCA2, BRIP1, CDH1, CDK4, CDKN2A, CHEK2, CTNNA1, DICER1, EPCAM, GREM1, HOXB13, KIT, MEN1, MLH1, MSH2, MSH3, MSH6, MUTYH, NBN, NF1, NTHL1, PALB2, PDGFRA, PMS2, POLD1, POLE, PTEN, RAD50, RAD51C, RAD51D, SDHA, SDHB, SDHC, SDHD, SMAD4, SMARCA4, STK11, TP53, TSC1, TSC2, VHL).  A Variant of Uncertain Significance was detected: BMPR1A c.1433G>A (p.[id number]). This is still considered a normal result. While at this time, it is unknown if this finding is associated with increased cancer risk, the majority of these variants get reclassified to be inconsequential. Medical management should not be based on this finding. With time, we suspect the lab will determine the significance, if any. If we do learn more about it, we will try to contact Julie Harding to discuss it further. It is important to stay in touch with [place] periodically and keep the address and phone number up to date.   Past Medical History:  Diagnosis Date  . DVT (deep venous thrombosis) (HCC)    Right leg  . Family history of cancer   . Pulmonary embolus ([place]) 08/2016    Past Surgical History:  Procedure Laterality Date  . FLEXIBLE SIGMOIDOSCOPY N/A 11/20/2017   Procedure: FLEXIBLE SIGMOIDOSCOPY;  Surgeon: Julie Essex, MD;  Location: [facility];  Service: Endoscopy;  Laterality: N/A;  . HOT HEMOSTASIS N/A 11/20/2017   Procedure: HOT HEMOSTASIS (  ARGON PLASMA COAGULATION/BICAP);  Surgeon: Julie Essex, MD;  Location: [facility];  Service: Endoscopy;  Laterality: N/A;  . LAPAROSCOPIC SUPRACERVICAL HYSTERECTOMY  2007   Leiomyoma  . ROTATOR Harding REPAIR    . thumb surg    . TUBAL LIGATION      Social History   Socioeconomic History  . Marital status: Married    Spouse name: Not on file  .  Number of children: Not on file  . Years of education: Not on file  . Highest education level: Not on file  Occupational History  . Not on file  Social Needs  . Financial resource strain: Not on file  . Food insecurity:    Worry: Not on file    Inability: Not on file  . Transportation needs:    Medical: Not on file    Non-medical: Not on file  Tobacco Use  . Smoking status: Never Smoker  . Smokeless tobacco: Never Used  Substance and Sexual Activity  . Alcohol use: Yes    Alcohol/week: 4.8 oz    Types: 8 Standard drinks or equivalent per week  . Drug use: No  . Sexual activity: Yes    Birth control/protection: Surgical    Comment: 1st intercourse 57 yo-fewer than 5 partners-HYST  Lifestyle  . Physical activity:    Days per week: Not on file    Minutes per session: Not on file  . Stress: Not on file  Relationships  . Social connections:    Talks on phone: Not on file    Gets together: Not on file    Attends religious service: Not on file    Active member of club or organization: Not on file    Attends meetings of clubs or organizations: Not on file    Relationship status: Not on file  Other Topics Concern  . Not on file  Social History Narrative  . Not on file     Family History:  During the visit, a 4-generation pedigree was obtained. Family tree will be scanned in the Media tab in Epic  Significant diagnoses include the following:  Family History  Problem Relation Age of Onset  . COPD Mother   . Prostate cancer Father 55       also throat ca; deceased 65  . Heart disease Brother   . Breast cancer Maternal Aunt 80  . Colon cancer Maternal Aunt 80       currently 26  . Breast cancer Cousin        dx 45s; daughter of mat aunt with cancer  . Breast cancer Cousin        dx 40s; daughter of mat aunt with cancer    Additionally, Julie Harding has no children. She has a sister (age 1) and 2 brothers (ages 29 and 63). Her mother died at 50. She had a brother and  sister (noted above). Her father (noted above) had one sister, but not much information is known about her.  Julie Harding ancestry is Caucasian - NOS. There is no known Jewish ancestry and no consanguinity.  Discussion: We reviewed the characteristics, features and inheritance patterns of hereditary cancer syndromes. We discussed her risk of harboring a mutation in the context of her personal and family history. We discussed the process of genetic testing, insurance coverage and implications of results: positive, negative and variant of unknown significance (VUS).    Julie Harding questions were answered to her satisfaction today and she is welcome to call with  any additional questions or concerns. Thank you for the referral and allowing [place] to share in the care of your patient.    Steele Berg, MS, [place] Certified Genetic Counselor phone: [phone] [email or web address]

## 2017-12-04 NOTE — Progress Notes (Signed)
I have reviewed this chart and agree with the RN/CMA assessment and management.    Arie Gable C Sausha Raymond, MD, FACOG Attending Physician, [facility]  

## 2017-12-12 ENCOUNTER — Encounter: Payer: Self-pay | Admitting: Sports Medicine

## 2017-12-12 ENCOUNTER — Ambulatory Visit: Payer: 59 | Admitting: Sports Medicine

## 2017-12-12 DIAGNOSIS — R635 Abnormal weight gain: Secondary | ICD-10-CM | POA: Insufficient documentation

## 2017-12-12 MED ORDER — PHENTERMINE HCL 37.5 MG PO TABS: ORAL_TABLET | ORAL | 0 refills | Status: DC

## 2017-12-12 NOTE — Assessment & Plan Note (Signed)
Starting phentermine, exercise prescription given. Return monthly for weight checks and refills.

## 2017-12-12 NOTE — Progress Notes (Signed)
Subjective:    CC: Discuss weight  HPI: This is a pleasant 57 year old female, she has struggled with her weight all of her life, she is not obese, barely overweight, but ideally she would like to be about 20 pounds lighter.  She is tried diet, she runs approximately 7 miles every day, and would like a jumpstart.  No chest pain, no history of cardiac disease, she does have a history of DVT and PE currently on Xarelto.  He would also like to establish care with 1 of the providers here.  I reviewed the past medical history, family history, social history, surgical history, and allergies today and no changes were needed.  Please see the problem list section below in epic for further details.  Past Medical History: Past Medical History:  Diagnosis Date  . DVT (deep venous thrombosis) (HCC)    Right leg  . Family history of cancer   . Genetic testing 12/01/2017   Common Cancers panel (47 genes) @ [facility] - No pathogenic mutations detected  . Pulmonary embolus ([place]) 08/2016   Past Surgical History: Past Surgical History:  Procedure Laterality Date  . FLEXIBLE SIGMOIDOSCOPY N/A 11/20/2017   Procedure: FLEXIBLE SIGMOIDOSCOPY;  Surgeon: Clarene Essex, MD;  Location: [facility];  Service: Endoscopy;  Laterality: N/A;  . HOT HEMOSTASIS N/A 11/20/2017   Procedure: HOT HEMOSTASIS (ARGON PLASMA COAGULATION/BICAP);  Surgeon: Clarene Essex, MD;  Location: [facility];  Service: Endoscopy;  Laterality: N/A;  . LAPAROSCOPIC SUPRACERVICAL HYSTERECTOMY  2007   Leiomyoma  . ROTATOR CUFF REPAIR    . thumb surg    . TUBAL LIGATION     Social History: Social History   Socioeconomic History  . Marital status: Married    Spouse name: Not on file  . Number of children: Not on file  . Years of education: Not on file  . Highest education level: Not on file  Occupational History  . Not on file  Social Needs  . Financial resource strain: Not on file  . Food insecurity:    Worry: Not on file   Inability: Not on file  . Transportation needs:    Medical: Not on file    Non-medical: Not on file  Tobacco Use  . Smoking status: Never Smoker  . Smokeless tobacco: Never Used  Substance and Sexual Activity  . Alcohol use: Yes    Alcohol/week: 4.8 oz    Types: 8 Standard drinks or equivalent per week  . Drug use: No  . Sexual activity: Yes    Birth control/protection: Surgical    Comment: 1st intercourse 57 yo-fewer than 5 partners-HYST  Lifestyle  . Physical activity:    Days per week: Not on file    Minutes per session: Not on file  . Stress: Not on file  Relationships  . Social connections:    Talks on phone: Not on file    Gets together: Not on file    Attends religious service: Not on file    Active member of club or organization: Not on file    Attends meetings of clubs or organizations: Not on file    Relationship status: Not on file  Other Topics Concern  . Not on file  Social History Narrative  . Not on file   Family History: Family History  Problem Relation Age of Onset  . COPD Mother   . Prostate cancer Father 58       also throat ca; deceased 29  . Heart disease Brother   .  Breast cancer Maternal Aunt 80  . Colon cancer Maternal Aunt 80       currently 29  . Breast cancer Cousin        dx 39s; daughter of mat aunt with cancer  . Breast cancer Cousin        dx 23s; daughter of mat aunt with cancer   Allergies: Allergies  Allergen Reactions  . Vicodin [Hydrocodone-Acetaminophen] Nausea And Vomiting   Medications: See med rec.  Review of Systems: No fevers, chills, night sweats, weight loss, chest pain, or shortness of breath.   Objective:    General: Well Developed, well nourished, and in no acute distress.  Neuro: Alert and oriented x3, extra-ocular muscles intact, sensation grossly intact.  HEENT: Normocephalic, atraumatic, pupils equal round reactive to light, neck supple, no masses, no lymphadenopathy, thyroid nonpalpable.  Skin: Warm and  dry, no rashes. Cardiac: Regular rate and rhythm, no murmurs rubs or gallops, no lower extremity edema.  Respiratory: Clear to auscultation bilaterally. Not using accessory muscles, speaking in full sentences.  Impression and Recommendations:    Abnormal weight gain Starting phentermine, exercise prescription given. Return monthly for weight checks and refills.  I spent 25 minutes with this patient, greater than 50% was face-to-face time counseling regarding the above diagnoses ___________________________________________ Gwen Her. Dianah Field, M.D., ABFM., CAQSM. Primary Care and Comfort Instructor of [place] [facility]

## 2017-12-20 ENCOUNTER — Ambulatory Visit: Payer: 59 | Admitting: Family Medicine

## 2017-12-21 ENCOUNTER — Ambulatory Visit
Admission: RE | Admit: 2017-12-21 | Discharge: 2017-12-21 | Disposition: A | Discharge status: Home / Self Care | Payer: 59 | Source: Ambulatory Visit | Attending: Obstetrics & Gynecology | Admitting: Obstetrics & Gynecology

## 2017-12-21 DIAGNOSIS — Z1231 Encounter for screening mammogram for malignant neoplasm of breast: Secondary | ICD-10-CM | POA: Diagnosis not present

## 2018-01-12 ENCOUNTER — Ambulatory Visit: Payer: 59 | Admitting: Sports Medicine

## 2018-01-12 ENCOUNTER — Encounter: Payer: Self-pay | Admitting: Sports Medicine

## 2018-01-12 DIAGNOSIS — R635 Abnormal weight gain: Secondary | ICD-10-CM

## 2018-01-12 MED ORDER — PHENTERMINE HCL 37.5 MG PO TABS: ORAL_TABLET | ORAL | 0 refills | Status: DC

## 2018-01-12 NOTE — Assessment & Plan Note (Addendum)
10 pound weight loss after the first month on phentermine, refilling, goal is approximately 15 more pounds of weight loss. I will probably continue monthly prescriptions, overshoot by about 5 pounds and then taper her to a half dose for 2-3 more months and then stop.

## 2018-01-12 NOTE — Progress Notes (Signed)
Subjective:    CC: Weight check  HPI: Julie Harding returns, she has lost 10 pounds, very happy with how things are going, mood is better, clothes are fitting better, continues to exercise, she would like to lose about 15 more pounds.  I reviewed the past medical history, family history, social history, surgical history, and allergies today and no changes were needed.  Please see the problem list section below in epic for further details.  Past Medical History: Past Medical History:  Diagnosis Date  . DVT (deep venous thrombosis) (HCC)    Right leg  . Family history of cancer   . Genetic testing 12/01/2017   Common Cancers panel (47 genes) @ [facility] - No pathogenic mutations detected  . Pulmonary embolus ([place]) 08/2016   Past Surgical History: Past Surgical History:  Procedure Laterality Date  . FLEXIBLE SIGMOIDOSCOPY N/A 11/20/2017   Procedure: FLEXIBLE SIGMOIDOSCOPY;  Surgeon: Clarene Essex, MD;  Location: [facility];  Service: Endoscopy;  Laterality: N/A;  . HOT HEMOSTASIS N/A 11/20/2017   Procedure: HOT HEMOSTASIS (ARGON PLASMA COAGULATION/BICAP);  Surgeon: Clarene Essex, MD;  Location: [facility];  Service: Endoscopy;  Laterality: N/A;  . LAPAROSCOPIC SUPRACERVICAL HYSTERECTOMY  2007   Leiomyoma  . ROTATOR CUFF REPAIR    . thumb surg    . TUBAL LIGATION     Social History: Social History   Socioeconomic History  . Marital status: Married    Spouse name: Not on file  . Number of children: Not on file  . Years of education: Not on file  . Highest education level: Not on file  Occupational History  . Not on file  Social Needs  . Financial resource strain: Not on file  . Food insecurity:    Worry: Not on file    Inability: Not on file  . Transportation needs:    Medical: Not on file    Non-medical: Not on file  Tobacco Use  . Smoking status: Never Smoker  . Smokeless tobacco: Never Used  Substance and Sexual Activity  . Alcohol use: Yes    Alcohol/week: 4.8 oz   Types: 8 Standard drinks or equivalent per week  . Drug use: No  . Sexual activity: Yes    Birth control/protection: Surgical    Comment: 1st intercourse 57 yo-fewer than 5 partners-HYST  Lifestyle  . Physical activity:    Days per week: Not on file    Minutes per session: Not on file  . Stress: Not on file  Relationships  . Social connections:    Talks on phone: Not on file    Gets together: Not on file    Attends religious service: Not on file    Active member of club or organization: Not on file    Attends meetings of clubs or organizations: Not on file    Relationship status: Not on file  Other Topics Concern  . Not on file  Social History Narrative  . Not on file   Family History: Family History  Problem Relation Age of Onset  . COPD Mother   . Prostate cancer Father 73       also throat ca; deceased 28  . Heart disease Brother   . Breast cancer Maternal Aunt 80  . Colon cancer Maternal Aunt 80       currently 36  . Breast cancer Cousin        dx 45s; daughter of mat aunt with cancer  . Breast cancer Cousin  dx 4s; daughter of mat aunt with cancer   Allergies: Allergies  Allergen Reactions  . Vicodin [Hydrocodone-Acetaminophen] Nausea And Vomiting   Medications: See med rec.  Review of Systems: No fevers, chills, night sweats, weight loss, chest pain, or shortness of breath.   Objective:    General: Well Developed, well nourished, and in no acute distress.  Neuro: Alert and oriented x3, extra-ocular muscles intact, sensation grossly intact.  HEENT: Normocephalic, atraumatic, pupils equal round reactive to light, neck supple, no masses, no lymphadenopathy, thyroid nonpalpable.  Skin: Warm and dry, no rashes. Cardiac: Regular rate and rhythm, no murmurs rubs or gallops, no lower extremity edema.  Respiratory: Clear to auscultation bilaterally. Not using accessory muscles, speaking in full sentences.  Impression and Recommendations:    Abnormal  weight gain 10 pound weight loss after the first month on phentermine, refilling, goal is approximately 15 more pounds of weight loss. I will probably continue monthly prescriptions, overshoot by about 5 pounds and then taper her to a half dose for 2-3 more months and then stop. ___________________________________________ Gwen Her. Dianah Field, M.D., ABFM., CAQSM. Primary Care and [place] Instructor of [place] of [facility] of Medicine

## 2018-01-22 ENCOUNTER — Encounter: Payer: Self-pay | Admitting: Physician Assistant

## 2018-01-22 ENCOUNTER — Ambulatory Visit: Payer: 59 | Admitting: Physician Assistant

## 2018-01-22 VITALS — BP 111/75 | HR 77 | Ht 62.0 in | Wt 126.0 lb

## 2018-01-22 DIAGNOSIS — Z23 Encounter for immunization: Secondary | ICD-10-CM | POA: Diagnosis not present

## 2018-01-22 DIAGNOSIS — I2699 Other pulmonary embolism without acute cor pulmonale: Secondary | ICD-10-CM | POA: Diagnosis not present

## 2018-01-22 DIAGNOSIS — Z7689 Persons encountering health services in other specified circumstances: Secondary | ICD-10-CM

## 2018-01-22 DIAGNOSIS — Z90710 Acquired absence of both cervix and uterus: Secondary | ICD-10-CM | POA: Diagnosis not present

## 2018-01-22 DIAGNOSIS — Z780 Asymptomatic menopausal state: Secondary | ICD-10-CM

## 2018-01-22 MED ORDER — CALCIUM CARBONATE-VITAMIN D 600-400 MG-UNIT PO TABS: 1.0000 | ORAL_TABLET | Freq: Two times a day (BID) | ORAL | 11 refills | Status: DC

## 2018-01-22 MED ORDER — RIVAROXABAN 10 MG PO TABS: 10.0000 mg | ORAL_TABLET | Freq: Every day | ORAL | 1 refills | Status: DC

## 2018-01-22 NOTE — Progress Notes (Signed)
HPI:                                                                Julie Harding is a 57 y.o. female who presents to [facility] today to establish care  Current concerns: Xarelto refill  Currently on Xarelto maintenance dose of 10 mg daily for history of recurrent VTE. Per Dr. Lake Bells (Pulmonology) she will need lifelong anticoagulation due to recurrent DVT/PE. Patient had initial unprovoked DVT PE 2015.  Hypercoagulable panel was negative. Recurrent PE  February 2018 while taking estrogen therapy She has a history of myalgias and weight gain with Eliquis. Denies any bruising or abnormal bleeding. She does have a history of recurrent lower GI bleed and is followed by Gastroenterology. Recently underwent sigmoidoscopy with electrocautery. Reports no blood in her stool since May 2019.  Depression screen [facility] 2/9 10/16/2017 08/22/2017 08/04/2017  Decreased Interest 3 2 2   Down, Depressed, Hopeless 1 2 3   PHQ - 2 Score 4 4 5   Altered sleeping 3 3 3   Tired, decreased energy 3 2 3   Change in appetite 3 0 1  Feeling bad or failure about yourself  1 2 2   Trouble concentrating 1 1 2   Moving slowly or fidgety/restless 0 0 0  Suicidal thoughts 0 0 0  PHQ-9 Score 15 12 16   Difficult doing work/chores - Somewhat difficult -    GAD 7 : Generalized Anxiety Score 10/16/2017  Nervous, Anxious, on Edge 0  Control/stop worrying 0  Worry too much - different things 1  Trouble relaxing 0  Restless 0  Easily annoyed or irritable 2  Afraid - awful might happen 0  Total GAD 7 Score 3      Past Medical History:  Diagnosis Date  . Acute deep vein thrombosis (DVT) of femoral vein of left lower extremity ([place]) 08/04/2017   DVT 2014 -  Idiopathic etiology this was several years ago.   Patient took 3 months of anticoagulation medications.  Marland Kitchen DVT (deep venous thrombosis) ([place]) 2015   Right leg  . Endometrioma 2008   found by colonoscopy   . Family  history of cancer   . Genetic testing 12/01/2017   Common Cancers panel (47 genes) @ [facility] - No pathogenic mutations detected  . Pulmonary embolus ([place]) 08/2016  . Uterine fibroid    Past Surgical History:  Procedure Laterality Date  . COLONOSCOPY    . FLEXIBLE SIGMOIDOSCOPY N/A 11/20/2017   Procedure: FLEXIBLE SIGMOIDOSCOPY;  Surgeon: Clarene Essex, MD;  [facility];  Service: Endoscopy;  Laterality: N/A;  . HOT HEMOSTASIS N/A 11/20/2017   Procedure: HOT HEMOSTASIS (ARGON PLASMA COAGULATION/BICAP);  Surgeon: Clarene Essex, MD;  Location: [facility];  Service: Endoscopy;  Laterality: N/A;  . LAPAROSCOPIC SUPRACERVICAL HYSTERECTOMY  2007   Leiomyoma  . ROTATOR CUFF REPAIR    . THUMB ARTHROSCOPY    . TUBAL LIGATION     Social History   Tobacco Use  . Smoking status: Never Smoker  . Smokeless tobacco: Never Used  Substance Use Topics  . Alcohol use: Yes    Alcohol/week: 4.2 oz    Types: 7 Standard drinks or equivalent per week   family history includes Breast cancer in her cousin and cousin;  Breast cancer (age of onset: 38) in her maternal aunt; COPD in her mother; Colon cancer (age of onset: 3) in her maternal aunt; Diabetes in her brother; Heart disease in her brother; Hypertension in her father; Prostate cancer (age of onset: 37) in her father; Stroke in her brother.    ROS: negative except as noted in the HPI  Medications: Current Outpatient Medications  Medication Sig Dispense Refill  . beta carotene 10000 UNIT capsule Take 50,000 Units by mouth daily.    . Biotin 10000 MCG TBDP Take 10,000 mcg by mouth daily.    . Black Cohosh 540 MG CAPS Take 540 mg by mouth daily.     . Lactobacillus (ACIDOPHILUS PROBIOTIC PO) Take 1 capsule by mouth 2 (two) times daily.    Marland Kitchen loratadine (CLARITIN) 10 MG tablet Take 10 mg by mouth daily.    . phentermine (ADIPEX-P) 37.5 MG tablet One tab by mouth qAM 30 tablet 0  . RASPBERRY KETONES PO Take 500 mg by mouth daily.    .  rivaroxaban (XARELTO) 10 MG TABS tablet Take 1 tablet (10 mg total) by mouth daily. 90 tablet 1  . Turmeric Curcumin 500 MG CAPS Take 500 mg by mouth daily.    . Calcium Carbonate-Vitamin D 600-400 MG-UNIT tablet Take 1 tablet by mouth 2 (two) times daily. 60 tablet 11   No current facility-administered medications for this visit.    Allergies  Allergen Reactions  . Estrogens Other (See Comments)    VTE  . Eliquis [Apixaban] Other (See Comments)    Myalgia, weight gain  . Vicodin [Hydrocodone-Acetaminophen] Nausea And Vomiting       Objective:  BP 111/75   Pulse 77   Ht 5\' 2"  (1.575 m)   Wt 126 lb (57.2 kg)   BMI 23.05 kg/m  Gen:  alert, not ill-appearing, no distress, appropriate for age HEENT: head normocephalic without obvious abnormality, conjunctiva and cornea clear, trachea midline Pulm: Normal work of breathing, normal phonation, clear to auscultation bilaterally, no wheezes, rales or rhonchi CV: Normal rate, regular rhythm, s1 and s2 distinct, no murmurs, clicks or rubs  Neuro: alert and oriented x 3, no tremor MSK: extremities atraumatic, normal gait and station, no peripheral edema Skin: intact, no rashes on exposed skin, no jaundice, no cyanosis Psych: well-groomed, cooperative, good eye contact, euthymic mood, affect mood-congruent, speech is articulate, and thought processes clear and goal-directed    No results found for this or any previous visit (from the past 72 hour(s)). No results found.    Assessment and Plan: 57 y.o. female with   Encounter to establish care  History of hysterectomy for benign disease  h/o Pulmonary embolism and infarction [facility]) - Feb 2018 - recurrent VTE while on estrogen. 2015 - unprovoked VTE   Postmenopausal - Plan: Calcium Carbonate-Vitamin D 600-400 MG-UNIT tablet  Need for Tdap vaccination - Plan: Tdap vaccine greater than or equal to 7yo IM  - Personally reviewed PMH, PSH, PFH, medications, allergies,  HM - Age-appropriate cancer screening: Pap and Mammogram UTD - Influenza n/a - Tdap updated today - PHQ2 negative - Personally reviewed labs from 07/2017, which were unremarkable  Recurrent VTE - unprovoked initially and provoked by estrogen in 2018. Lifelong anticoag with Xarelto - 6 month refills provided today   Patient education and anticipatory guidance given Patient agrees with treatment plan Follow-up in 6 months for medication management or sooner as needed if symptoms worsen or fail to improve  Darlyne Russian PA-C

## 2018-01-22 NOTE — Patient Instructions (Signed)
Preventing Osteoporosis, Adult Osteoporosis is a condition that causes the bones to get weaker. With osteoporosis, the bones become thinner, and the normal spaces in bone tissue become larger. This can make the bones weak and cause them to break more easily. People who have osteoporosis are more likely to break their wrist, spine, or hip. Even a minor accident or injury can be enough to break weak bones. Osteoporosis can occur with aging. Your body constantly replaces old bone tissue with new tissue. As you get older, you may lose bone tissue more quickly, or it may be replaced more slowly. Osteoporosis is more likely to develop if you have poor nutrition or do not get enough calcium or vitamin D. Other lifestyle factors can also play a role. By making some diet and lifestyle changes, you can help to keep your bones healthy and help to prevent osteoporosis. What nutrition changes can be made? Nutrition plays an important role in maintaining healthy, strong bones.  Make sure you get enough calcium every day from food or from calcium supplements. ? If you are age 50 or younger, aim to get 1,000 mg of calcium every day. ? If you are older than age 50, aim to get 1,200 mg of calcium every day.  Try to get enough vitamin D every day. ? If you are age 70 or younger, aim to get 600 international units (IU) every day. ? If you are older than age 70, aim to get 800 international units every day.  Follow a healthy diet. Eat plenty of foods that contain calcium and vitamin D. ? Calcium is in milk, cheese, yogurt, and other dairy products. Some fish and vegetables are also good sources of calcium. Many foods such as cereals and breads have had calcium added to them (are fortified). Check nutrition labels to see how much calcium is in a food or drink. ? Foods that contain vitamin D include milk, cereals, salmon, and tuna. Your body also makes vitamin D when you are out in the sun. Bare skin exposure to the sun on  your face, arms, legs, or back for no more than 30 minutes a day, 2 times per week is more than enough. Beyond that, it is important to use sunblock to protect your skin from sunburn, which increases your risk for skin cancer.  What lifestyle changes can be made? Making changes in your everyday life can also play an important role in preventing osteoporosis.  Stay active and get exercise every day. Ask your health care provider what types of exercise are best for you.  Do not use any products that contain nicotine or tobacco, such as cigarettes and e-cigarettes. If you need help quitting, ask your health care provider.  Limit alcohol intake to no more than 1 drink a day for nonpregnant women and 2 drinks a day for men. One drink equals 12 oz of beer, 5 oz of wine, or 1 oz of hard liquor.  Why are these changes important? Making these nutrition and lifestyle changes can:  Help you develop and maintain healthy, strong bones.  Prevent loss of bone mass and the problems that are caused by that loss, such as broken bones and delayed healing.  Make you feel better mentally and physically.  What can happen if changes are not made? Problems that can result from osteoporosis can be very serious. These may include:  A higher risk of broken bones that are painful and do not heal well.  Physical malformations, such as   a collapsed spine or a hunched back.  Problems with movement.  Where to find support: If you need help making changes to prevent osteoporosis, talk with your health care provider. You can ask for a referral to a diet and nutrition specialist (dietitian) and a physical therapist. Where to find more information: Learn more about osteoporosis from:  NIH Osteoporosis and Related Bone Diseases National Resource Center: www.niams.nih.gov/health_info/bone/osteoporosis/osteoporosis_ff.asp  U.S. Office on Women's Health:  www.womenshealth.gov/publications/our-publications/fact-sheet/osteoporosis.html  National Osteoporosis Foundation: www.nof.org/patients/what-is-osteoporosis/  Summary  Osteoporosis is a condition that causes weak bones that are more likely to break.  Eating a healthy diet and making sure you get enough calcium and vitamin D can help prevent osteoporosis.  Other ways to reduce your risk of osteoporosis include getting regular exercise and avoiding alcohol and products that contain nicotine or tobacco. This information is not intended to replace advice given to you by your health care provider. Make sure you discuss any questions you have with your health care provider. Document Released: 07/26/2015 Document Revised: 03/21/2016 Document Reviewed: 03/21/2016 Elsevier Interactive Patient Education  2018 Elsevier Inc.  

## 2018-02-02 IMAGING — MR MR LUMBAR SPINE W/O CM
4 of 5 series · 18 of 48 positions shown · non-contrast
Comparison: MRI MRI lumbar 06/14/2015. MRI of the knee was
performed 02/06/2017.

CLINICAL DATA: Low back and BILATERAL buttock pain, symptoms for 2
years. Crushing feeling RIGHT knee.

EXAM:
MRI LUMBAR SPINE WITHOUT CONTRAST
TECHNIQUE: Multiplanar, multisequence MR imaging of the lumbar spine was
performed. No intravenous contrast was administered.

[Series 9: T2 · sagittal · 4.0mm · 0.73mm/px · 6 of 15 slices shown (1 of 2)]
[im 1/15]
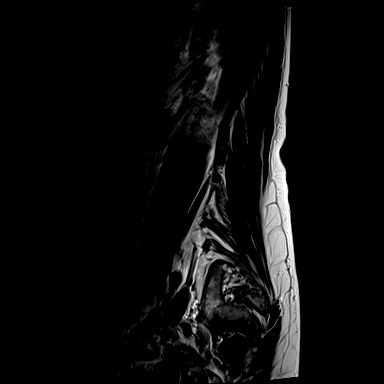
[im 3/15]
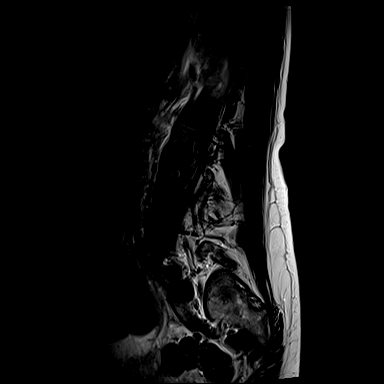
[im 6/15]
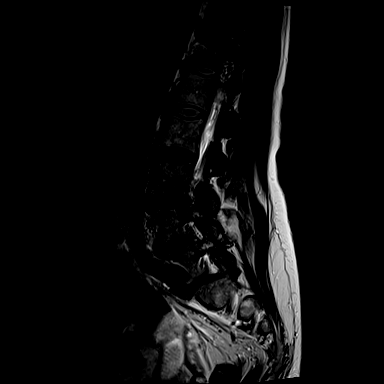
[im 9/15]
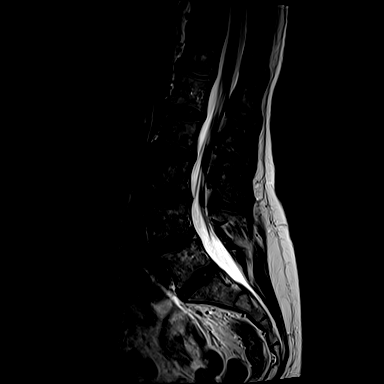
[im 12/15]
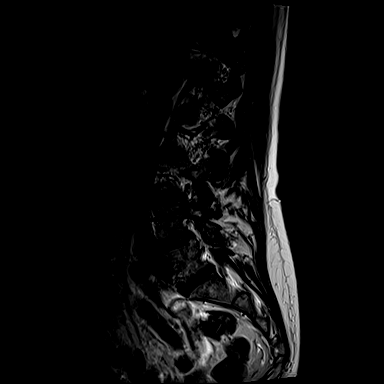
[im 15/15]
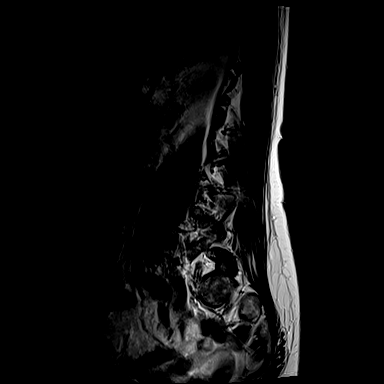

[Series 11: T1 · sagittal · 4.0mm · 0.73mm/px · 3 of 15 slices shown (1 of 2)]
[im 3/15]
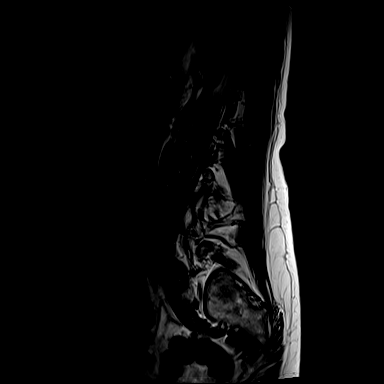
[im 9/15]
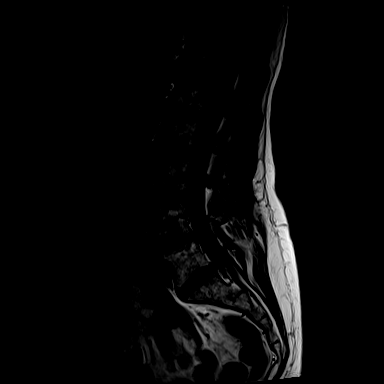
[im 15/15]
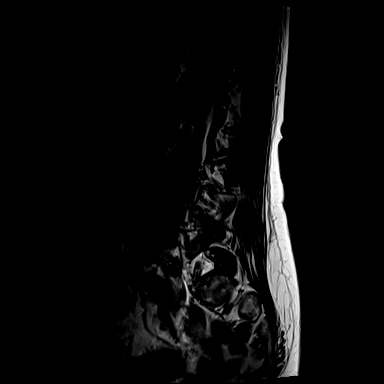

[Series 14: T2 · axial · 4.0mm · 0.28mm/px · z∈[-36,+144]mm · 6 of 39 slices shown (2 of 2)]
[im 1/39]
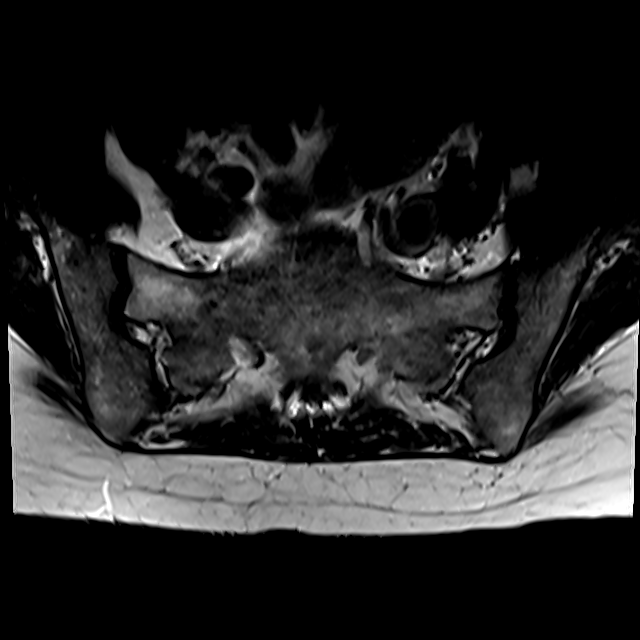
[im 6/39]
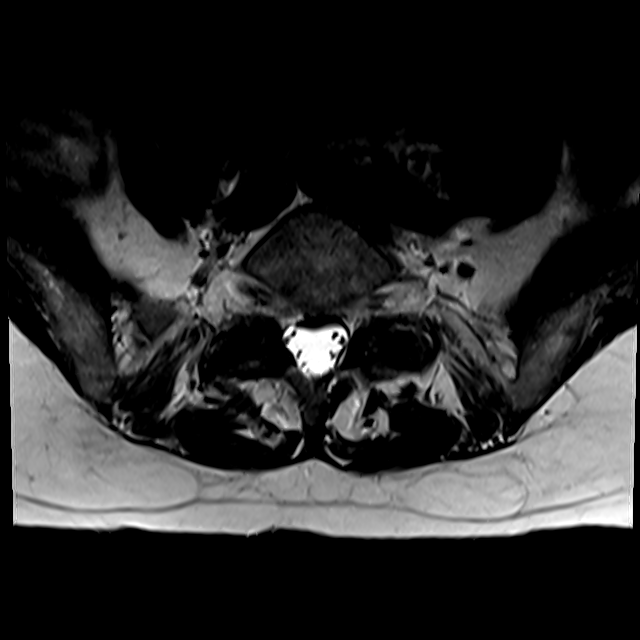
[im 11/39]
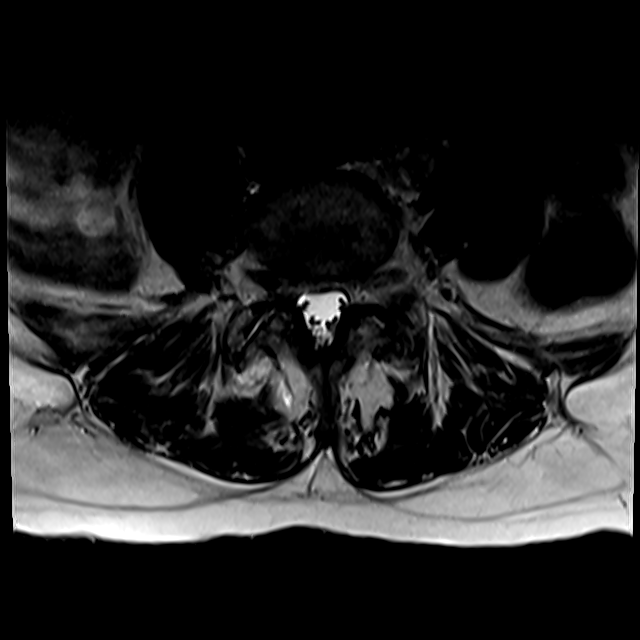
[im 17/39]
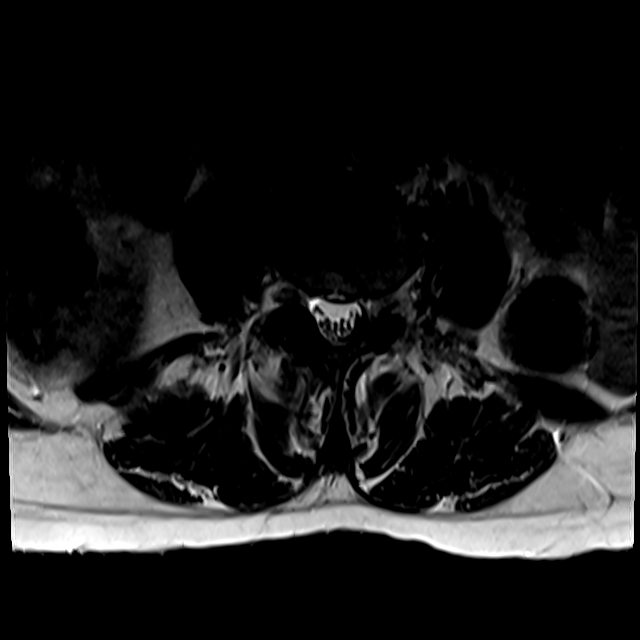
[im 20/39]
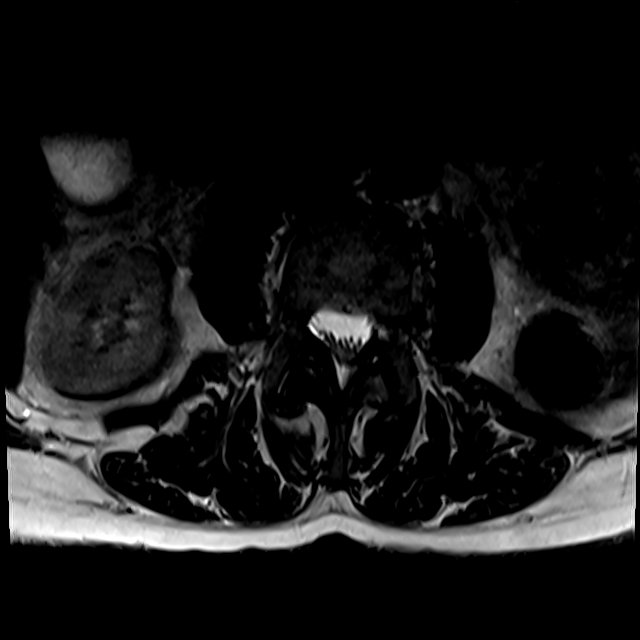
[im 33/39]
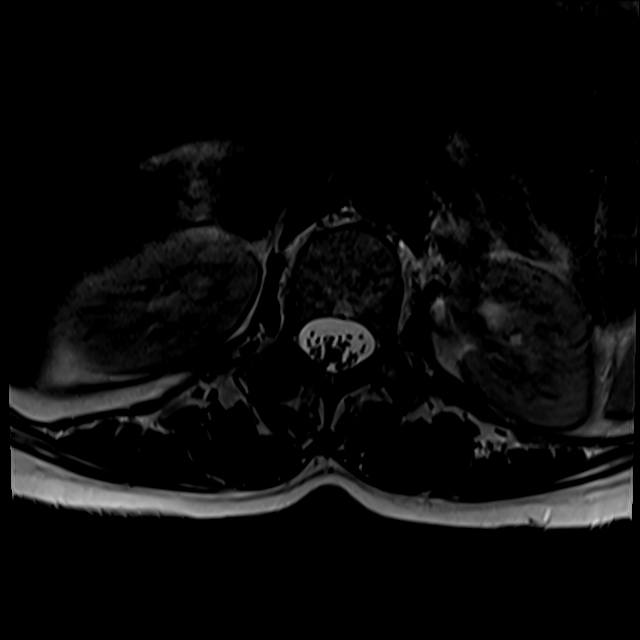

[Series 17: T1 · axial · 4.0mm · 0.28mm/px · z∈[-12,+144]mm · 3 of 39 slices shown (2 of 2)]
[im 6/39]
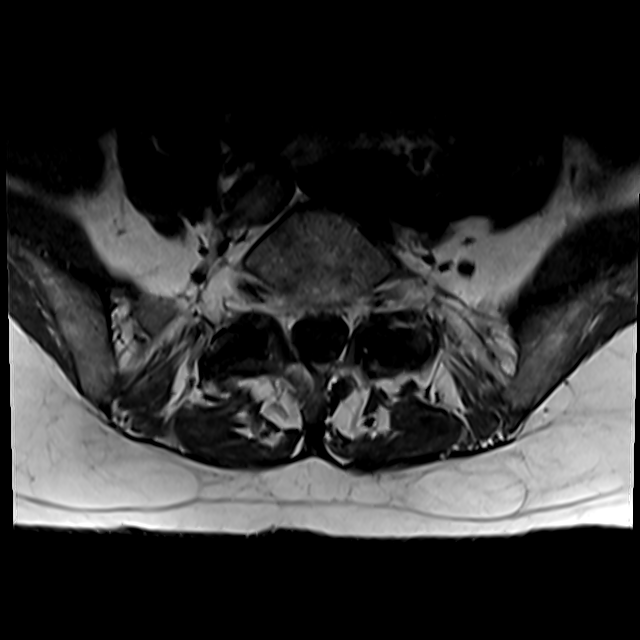
[im 20/39]
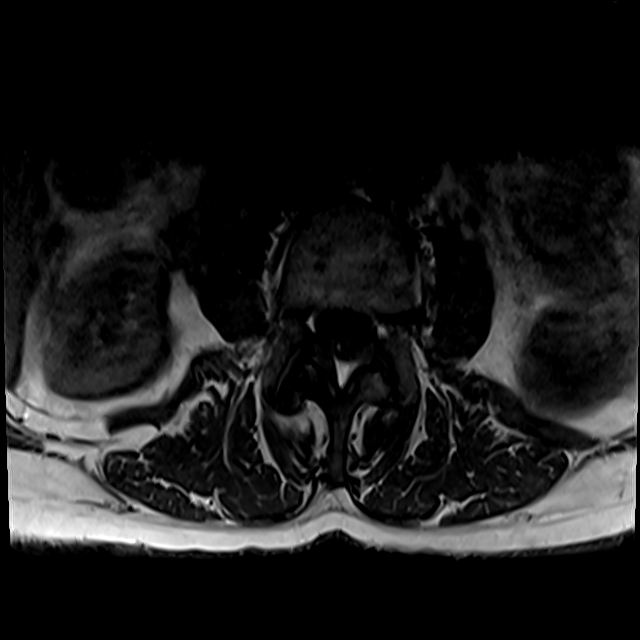
[im 33/39]
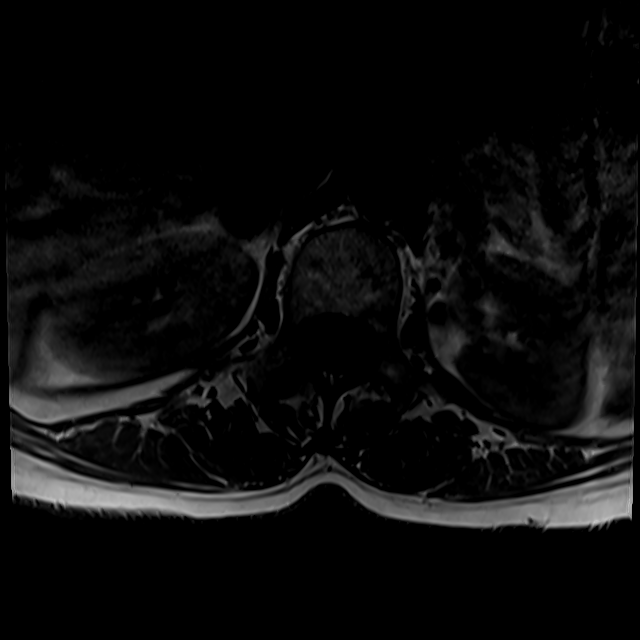

[18 of 48 positions shown; findings below may reference images not displayed]

FINDINGS: Segmentation:  Standard.

Alignment: 2 mm anterolisthesis L4-5. Slight degenerative scoliosis
convex LEFT mid lumbar region.

Vertebrae:  No worrisome osseous lesion.

Conus medullaris and cauda equina: Conus extends to the L1. Level.
Conus and cauda equina appear normal.

Paraspinal and other soft tissues: Unchanged cisterna chyli, LEFT
paravertebral region. No adenopathy.

Disc levels:

L1-L2:  Normal.

L2-L3:  Normal.

L3-L4: Slight disc desiccation. Central protrusion. Posterior
element hypertrophy. Borderline LEFT subarticular zone narrowing,
potentially affecting the LEFT L4 nerve root. No definite
RIGHT-sided neural impingement. No foraminal narrowing.

L4-L5: 2 mm anterolisthesis is facet mediated. There is an annular
rent centrally. Facet arthropathy and ligamentum flavum hypertrophy
is noted. There is no definite impingement.

L5-S1: Unremarkable disc space. Facet arthropathy. No definite
impingement.

Compared with priors, a similar appearance is noted.
IMPRESSION: Mild multilevel spondylosis. Unchanged central protrusion at L3-4,
possible LEFT L4 impingement.

2 mm facet mediated slip L4-5, with small annular rent centrally but
no definite impingement. Dynamic instability not excluded however.
Consider standing flexion extension views for further evaluation.

## 2018-02-12 ENCOUNTER — Ambulatory Visit: Payer: 59 | Admitting: Sports Medicine

## 2018-02-12 ENCOUNTER — Encounter: Payer: Self-pay | Admitting: Sports Medicine

## 2018-02-12 DIAGNOSIS — R635 Abnormal weight gain: Secondary | ICD-10-CM | POA: Diagnosis not present

## 2018-02-12 MED ORDER — PHENTERMINE HCL 37.5 MG PO TABS: ORAL_TABLET | ORAL | 0 refills | Status: DC

## 2018-02-12 NOTE — Progress Notes (Signed)
Subjective:    CC: Weight check  HPI: Julie Harding has lost 6 more pounds after the second month of phentermine, goal is 9 more pounds of weight loss.  She is having a bit of a dry mouth but consumes a good amount of water to combat this.  I reviewed the past medical history, family history, social history, surgical history, and allergies today and no changes were needed.  Please see the problem list section below in epic for further details.  Past Medical History: Past Medical History:  Diagnosis Date  . Acute deep vein thrombosis (DVT) of femoral vein of left lower extremity ([place]) 08/04/2017   DVT 2014 -  Idiopathic etiology this was several years ago.   Patient took 3 months of anticoagulation medications.  Marland Kitchen DVT (deep venous thrombosis) ([place]) 2015   Right leg  . Endometrioma 2008   found by colonoscopy   . Family history of cancer   . Genetic testing 12/01/2017   Common Cancers panel (47 genes) @ [facility] - No pathogenic mutations detected  . Pulmonary embolus ([place]) 08/2016  . Uterine fibroid    Past Surgical History: Past Surgical History:  Procedure Laterality Date  . COLONOSCOPY    . FLEXIBLE SIGMOIDOSCOPY N/A 11/20/2017   Procedure: FLEXIBLE SIGMOIDOSCOPY;  Surgeon: Clarene Essex, MD;  [facility]: [facility];  Service: Endoscopy;  Laterality: N/A;  . HOT HEMOSTASIS N/A 11/20/2017   Procedure: HOT HEMOSTASIS (ARGON PLASMA COAGULATION/BICAP);  Surgeon: Clarene Essex, MD;  Location: [facility];  Service: Endoscopy;  Laterality: N/A;  . LAPAROSCOPIC SUPRACERVICAL HYSTERECTOMY  2007   Leiomyoma  . ROTATOR CUFF REPAIR    . THUMB ARTHROSCOPY    . TUBAL LIGATION     Social History: Social History   Socioeconomic History  . Marital status: Married    Spouse name: Not on file  . Number of children: Not on file  . Years of education: Not on file  . Highest education level: Not on file  Occupational History  . Not on file  Social Needs  . Financial resource strain: Not on file    . Food insecurity:    Worry: Not on file    Inability: Not on file  . Transportation needs:    Medical: Not on file    Non-medical: Not on file  Tobacco Use  . Smoking status: Never Smoker  . Smokeless tobacco: Never Used  Substance and Sexual Activity  . Alcohol use: Yes    Alcohol/week: 4.2 oz    Types: 7 Standard drinks or equivalent per week  . Drug use: No  . Sexual activity: Yes    Birth control/protection: Surgical    Comment: 1st intercourse 57 yo-fewer than 5 partners-HYST  Lifestyle  . Physical activity:    Days per week: Not on file    Minutes per session: Not on file  . Stress: Not on file  Relationships  . Social connections:    Talks on phone: Not on file    Gets together: Not on file    Attends religious service: Not on file    Active member of club or organization: Not on file    Attends meetings of clubs or organizations: Not on file    Relationship status: Not on file  Other Topics Concern  . Not on file  Social History Narrative  . Not on file   Family History: Family History  Problem Relation Age of Onset  . COPD Mother   . Prostate cancer Father 72  also throat ca; deceased 33  . Hypertension Father   . Heart disease Brother   . Diabetes Brother   . Stroke Brother   . Breast cancer Maternal Aunt 80  . Colon cancer Maternal Aunt 80       currently 12  . Breast cancer Cousin        dx 46s; daughter of mat aunt with cancer  . Breast cancer Cousin        dx 71s; daughter of mat aunt with cancer   Allergies: Allergies  Allergen Reactions  . Estrogens Other (See Comments)    VTE  . Eliquis [Apixaban] Other (See Comments)    Myalgia, weight gain  . Vicodin [Hydrocodone-Acetaminophen] Nausea And Vomiting   Medications: See med rec.  Review of Systems: No fevers, chills, night sweats, weight loss, chest pain, or shortness of breath.   Objective:    General: Well Developed, well nourished, and in no acute distress.  Neuro: Alert  and oriented x3, extra-ocular muscles intact, sensation grossly intact.  HEENT: Normocephalic, atraumatic, pupils equal round reactive to light, neck supple, no masses, no lymphadenopathy, thyroid nonpalpable.  Skin: Warm and dry, no rashes. Cardiac: Regular rate and rhythm, no murmurs rubs or gallops, no lower extremity edema.  Respiratory: Clear to auscultation bilaterally. Not using accessory muscles, speaking in full sentences.  Impression and Recommendations:    Abnormal weight gain Additional 6 pound weight loss. Goal is 9 more pounds. Entering the third month. Return in 1 month. ___________________________________________ Julie Harding. Dianah Field, M.D., ABFM., CAQSM. Primary Care and [place] Instructor of [place] of [facility] of Medicine

## 2018-02-12 NOTE — Assessment & Plan Note (Signed)
Additional 6 pound weight loss. Goal is 9 more pounds. Entering the third month. Return in 1 month.

## 2018-03-14 ENCOUNTER — Ambulatory Visit (INDEPENDENT_AMBULATORY_CARE_PROVIDER_SITE_OTHER): Payer: 59 | Admitting: Sports Medicine

## 2018-03-14 ENCOUNTER — Encounter: Payer: Self-pay | Admitting: Sports Medicine

## 2018-03-14 DIAGNOSIS — R635 Abnormal weight gain: Secondary | ICD-10-CM | POA: Diagnosis not present

## 2018-03-14 MED ORDER — PHENTERMINE HCL 37.5 MG PO TABS: ORAL_TABLET | ORAL | 0 refills | Status: DC

## 2018-03-14 MED ORDER — TOPIRAMATE 50 MG PO TABS
ORAL_TABLET | ORAL | 0 refills | Status: DC
Start: 2018-03-14 — End: 2018-04-13

## 2018-03-14 NOTE — Assessment & Plan Note (Signed)
4 pound additional weight loss, she is 5 pounds from her goal now. Restarting phentermine, adding Topamax to accelerate weight loss for now. When she has her goal we will start to down taper phentermine over 3 months.

## 2018-03-14 NOTE — Progress Notes (Signed)
Subjective:    CC: Weight check  HPI: Julie Harding returns, she has lost another 4 pounds and is now 5 pounds from her goal.  I reviewed the past medical history, family history, social history, surgical history, and allergies today and no changes were needed.  Please see the problem list section below in epic for further details.  Past Medical History: Past Medical History:  Diagnosis Date  . Acute deep vein thrombosis (DVT) of femoral vein of left lower extremity ([place]) 08/04/2017   DVT 2014 -  Idiopathic etiology this was several years ago.   Patient took 3 months of anticoagulation medications.  Marland Kitchen DVT (deep venous thrombosis) ([place]) 2015   Right leg  . Endometrioma 2008   found by colonoscopy   . Family history of cancer   . Genetic testing 12/01/2017   Common Cancers panel (47 genes) @ [facility] - No pathogenic mutations detected  . Pulmonary embolus ([place]) 08/2016  . Uterine fibroid    Past Surgical History: Past Surgical History:  Procedure Laterality Date  . COLONOSCOPY    . FLEXIBLE SIGMOIDOSCOPY N/A 11/20/2017   Procedure: FLEXIBLE SIGMOIDOSCOPY;  Surgeon: Clarene Essex, MD;  Location: [facility];  Service: Endoscopy;  Laterality: N/A;  . HOT HEMOSTASIS N/A 11/20/2017   Procedure: HOT HEMOSTASIS (ARGON PLASMA COAGULATION/BICAP);  Surgeon: Clarene Essex, MD;  Location: [facility];  Service: Endoscopy;  Laterality: N/A;  . LAPAROSCOPIC SUPRACERVICAL HYSTERECTOMY  2007   Leiomyoma  . ROTATOR CUFF REPAIR    . THUMB ARTHROSCOPY    . TUBAL LIGATION     Social History: Social History   Socioeconomic History  . Marital status: Married    Spouse name: Not on file  . Number of children: Not on file  . Years of education: Not on file  . Highest education level: Not on file  Occupational History  . Not on file  Social Needs  . Financial resource strain: Not on file  . Food insecurity:    Worry: Not on file    Inability: Not on file  . Transportation needs:    Medical: Not  on file    Non-medical: Not on file  Tobacco Use  . Smoking status: Never Smoker  . Smokeless tobacco: Never Used  Substance and Sexual Activity  . Alcohol use: Yes    Alcohol/week: 7.0 standard drinks    Types: 7 Standard drinks or equivalent per week  . Drug use: No  . Sexual activity: Yes    Birth control/protection: Surgical    Comment: 1st intercourse 57 yo-fewer than 5 partners-HYST  Lifestyle  . Physical activity:    Days per week: Not on file    Minutes per session: Not on file  . Stress: Not on file  Relationships  . Social connections:    Talks on phone: Not on file    Gets together: Not on file    Attends religious service: Not on file    Active member of club or organization: Not on file    Attends meetings of clubs or organizations: Not on file    Relationship status: Not on file  Other Topics Concern  . Not on file  Social History Narrative  . Not on file   Family History: Family History  Problem Relation Age of Onset  . COPD Mother   . Prostate cancer Father 43       also throat ca; deceased 6  . Hypertension Father   . Heart disease Brother   . Diabetes  Brother   . Stroke Brother   . Breast cancer Maternal Aunt 80  . Colon cancer Maternal Aunt 80       currently 17  . Breast cancer Cousin        dx 53s; daughter of mat aunt with cancer  . Breast cancer Cousin        dx 38s; daughter of mat aunt with cancer   Allergies: Allergies  Allergen Reactions  . Estrogens Other (See Comments)    VTE  . Eliquis [Apixaban] Other (See Comments)    Myalgia, weight gain  . Vicodin [Hydrocodone-Acetaminophen] Nausea And Vomiting   Medications: See med rec.  Review of Systems: No fevers, chills, night sweats, weight loss, chest pain, or shortness of breath.   Objective:    General: Well Developed, well nourished, and in no acute distress.  Neuro: Alert and oriented x3, extra-ocular muscles intact, sensation grossly intact.  HEENT: Normocephalic,  atraumatic, pupils equal round reactive to light, neck supple, no masses, no lymphadenopathy, thyroid nonpalpable.  Skin: Warm and dry, no rashes. Cardiac: Regular rate and rhythm, no murmurs rubs or gallops, no lower extremity edema.  Respiratory: Clear to auscultation bilaterally. Not using accessory muscles, speaking in full sentences.  Impression and Recommendations:    Abnormal weight gain 4 pound additional weight loss, she is 5 pounds from her goal now. Restarting phentermine, adding Topamax to accelerate weight loss for now. When she has her goal we will start to down taper phentermine over 3 months. ___________________________________________ Gwen Her. Dianah Field, M.D., ABFM., CAQSM. Primary Care and [place] Instructor of [place] of [facility] of Medicine

## 2018-04-11 ENCOUNTER — Ambulatory Visit: Payer: 59 | Admitting: Sports Medicine

## 2018-04-13 ENCOUNTER — Encounter: Payer: Self-pay | Admitting: Sports Medicine

## 2018-04-13 ENCOUNTER — Ambulatory Visit: Payer: 59 | Admitting: Sports Medicine

## 2018-04-13 ENCOUNTER — Other Ambulatory Visit: Payer: Self-pay | Admitting: Sports Medicine

## 2018-04-13 DIAGNOSIS — R635 Abnormal weight gain: Secondary | ICD-10-CM

## 2018-04-13 DIAGNOSIS — Z9889 Other specified postprocedural states: Secondary | ICD-10-CM | POA: Diagnosis not present

## 2018-04-13 DIAGNOSIS — R6889 Other general symptoms and signs: Secondary | ICD-10-CM

## 2018-04-13 LAB — COMPREHENSIVE METABOLIC PANEL WITH GFR
AG Ratio: 2.2 (calc) (ref 1.0–2.5)
ALT: 16 U/L (ref 6–29)
Albumin: 4.3 g/dL (ref 3.6–5.1)
Alkaline phosphatase (APISO): 74 U/L (ref 33–130)
Calcium: 10.2 mg/dL (ref 8.6–10.4)
Total Bilirubin: 1 mg/dL (ref 0.2–1.2)
Total Protein: 6.3 g/dL (ref 6.1–8.1)

## 2018-04-13 LAB — TSH: TSH: 1.85 mIU/L

## 2018-04-13 LAB — CBC
HCT: 42.8 % (ref 35.0–45.0)
Hemoglobin: 14.6 g/dL (ref 11.7–15.5)
MCH: 31.7 pg (ref 27.0–33.0)
MCHC: 34.1 g/dL (ref 32.0–36.0)
MCV: 93 fL (ref 80.0–100.0)
MPV: 9.5 fL (ref 7.5–12.5)
Platelets: 289 10*3/uL (ref 140–400)
RBC: 4.6 Million/uL (ref 3.80–5.10)
RDW: 11.4 % (ref 11.0–15.0)
WBC: 6.3 10*3/uL (ref 3.8–10.8)

## 2018-04-13 LAB — COMPREHENSIVE METABOLIC PANEL
AST: 21 U/L (ref 10–35)
BUN: 15 mg/dL (ref 7–25)
CO2: 27 mmol/L (ref 20–32)
Chloride: 106 mmol/L (ref 98–110)
Creat: 0.76 mg/dL (ref 0.50–1.05)
Globulin: 2 g/dL (calc) (ref 1.9–3.7)
Glucose, Bld: 97 mg/dL (ref 65–139)
Potassium: 4 mmol/L (ref 3.5–5.3)
Sodium: 140 mmol/L (ref 135–146)

## 2018-04-13 MED ORDER — PHENTERMINE HCL 37.5 MG PO TABS: ORAL_TABLET | ORAL | 0 refills | Status: DC

## 2018-04-13 NOTE — Assessment & Plan Note (Signed)
Recurrence of impingement type symptoms. Adding formal physical therapy. Return to see me in a month, subacromial injection if no better.

## 2018-04-13 NOTE — Assessment & Plan Note (Signed)
Refilling phentermine, I am going to give her 60 pills, she will do a full pill for the first month and then 1/2 pill for the next 2 months.

## 2018-04-13 NOTE — Progress Notes (Signed)
Subjective:    CC: Multiple issues  HPI: Abnormal weight gain: Would like to try another month of phentermine to get down a couple more pounds.  Temperature intolerance: Feels hot when everyone is cold and feels cold when everyone is hot, no other symptoms.  Insomnia: Normal sleep latency but tends to wake up through the night.  Occasionally she has to void but other times she simply wakes up without a cause.  Sleep hygiene consists of TV and bright screen usage before bedtime.  She does not exercise, does not consume beverages short of a glass of wine it may be an hour before bedtime.  No caffeine use.  No exercise.  She desires to avoid pharmacologic approaches.  Right shoulder pain: History of rotator cuff repair, now with a recurrence of pain over the deltoid, worse with abduction of the shoulder, moderate, persistent without radiation.  I reviewed the past medical history, family history, social history, surgical history, and allergies today and no changes were needed.  Please see the problem list section below in epic for further details.  Past Medical History: Past Medical History:  Diagnosis Date  . Acute deep vein thrombosis (DVT) of femoral vein of left lower extremity ([place]) 08/04/2017   DVT 2014 -  Idiopathic etiology this was several years ago.   Patient took 3 months of anticoagulation medications.  Marland Kitchen DVT (deep venous thrombosis) ([place]) 2015   Right leg  . Endometrioma 2008   found by colonoscopy   . Family history of cancer   . Genetic testing 12/01/2017   Common Cancers panel (47 genes) @ [facility] - No pathogenic mutations detected  . Pulmonary embolus ([place]) 08/2016  . Uterine fibroid    Past Surgical History: Past Surgical History:  Procedure Laterality Date  . COLONOSCOPY    . FLEXIBLE SIGMOIDOSCOPY N/A 11/20/2017   Procedure: FLEXIBLE SIGMOIDOSCOPY;  Surgeon: Clarene Essex, MD;  Location: [facility];  Service: Endoscopy;  Laterality: N/A;  . HOT HEMOSTASIS N/A  11/20/2017   Procedure: HOT HEMOSTASIS (ARGON PLASMA COAGULATION/BICAP);  Surgeon: Clarene Essex, MD;  Location: [facility];  Service: Endoscopy;  Laterality: N/A;  . LAPAROSCOPIC SUPRACERVICAL HYSTERECTOMY  2007   Leiomyoma  . ROTATOR CUFF REPAIR    . THUMB ARTHROSCOPY    . TUBAL LIGATION     Social History: Social History   Socioeconomic History  . Marital status: Married    Spouse name: Not on file  . Number of children: Not on file  . Years of education: Not on file  . Highest education level: Not on file  Occupational History  . Not on file  Social Needs  . Financial resource strain: Not on file  . Food insecurity:    Worry: Not on file    Inability: Not on file  . Transportation needs:    Medical: Not on file    Non-medical: Not on file  Tobacco Use  . Smoking status: Never Smoker  . Smokeless tobacco: Never Used  Substance and Sexual Activity  . Alcohol use: Yes    Alcohol/week: 7.0 standard drinks    Types: 7 Standard drinks or equivalent per week  . Drug use: No  . Sexual activity: Yes    Birth control/protection: Surgical    Comment: 1st intercourse 57 yo-fewer than 5 partners-HYST  Lifestyle  . Physical activity:    Days per week: Not on file    Minutes per session: Not on file  . Stress: Not on file  Relationships  .  Social connections:    Talks on phone: Not on file    Gets together: Not on file    Attends religious service: Not on file    Active member of club or organization: Not on file    Attends meetings of clubs or organizations: Not on file    Relationship status: Not on file  Other Topics Concern  . Not on file  Social History Narrative  . Not on file   Family History: Family History  Problem Relation Age of Onset  . COPD Mother   . Prostate cancer Father 78       also throat ca; deceased 57  . Hypertension Father   . Heart disease Brother   . Diabetes Brother   . Stroke Brother   . Breast cancer Maternal Aunt 80  . Colon cancer  Maternal Aunt 80       currently 41  . Breast cancer Cousin        dx 47s; daughter of mat aunt with cancer  . Breast cancer Cousin        dx 22s; daughter of mat aunt with cancer   Allergies: Allergies  Allergen Reactions  . Estrogens Other (See Comments)    VTE  . Eliquis [Apixaban] Other (See Comments)    Myalgia, weight gain  . Vicodin [Hydrocodone-Acetaminophen] Nausea And Vomiting   Medications: See med rec.  Review of Systems: No fevers, chills, night sweats, weight loss, chest pain, or shortness of breath.   Objective:    General: Well Developed, well nourished, and in no acute distress.  Neuro: Alert and oriented x3, extra-ocular muscles intact, sensation grossly intact.  HEENT: Normocephalic, atraumatic, pupils equal round reactive to light, neck supple, no masses, no lymphadenopathy, thyroid nonpalpable.  Skin: Warm and dry, no rashes. Cardiac: Regular rate and rhythm, no murmurs rubs or gallops, no lower extremity edema.  Respiratory: Clear to auscultation bilaterally. Not using accessory muscles, speaking in full sentences. Right shoulder: Inspection reveals no abnormalities, atrophy or asymmetry. Palpation is normal with no tenderness over AC joint or bicipital groove. ROM is full in all planes. Rotator cuff strength normal with the exception of external rotation, she has a bit of weakness. Positive Neer and Hawkin's tests, empty can. Speeds and Yergason's tests normal. No labral pathology noted with negative Obrien's, negative crank, negative clunk, and good stability. Normal scapular function observed. No painful arc and no drop arm sign. No apprehension sign  Impression and Recommendations:    Abnormal weight gain Refilling phentermine, I am going to give her 60 pills, she will do a full pill for the first month and then 1/2 pill for the next 2 months.  Temperature intolerance Checking some labs. If all normal and this is just normal her.  History of  repair of right rotator cuff Recurrence of impingement type symptoms. Adding formal physical therapy. Return to see me in a month, subacromial injection if no better.  I spent 40 minutes with this patient, greater than 50% was face-to-face time counseling regarding the above diagnoses, we specifically discussed all of her medical problems, sleep hygiene and her service dog. ___________________________________________ Gwen Her. Dianah Field, M.D., ABFM., CAQSM. Primary Care and [place] Instructor of [place] of [facility] of Medicine

## 2018-04-13 NOTE — Assessment & Plan Note (Signed)
Checking some labs. If all normal and this is just normal her.

## 2018-08-06 ENCOUNTER — Ambulatory Visit (INDEPENDENT_AMBULATORY_CARE_PROVIDER_SITE_OTHER): Payer: 59 | Admitting: Physician Assistant

## 2018-08-06 ENCOUNTER — Encounter: Payer: Self-pay | Admitting: Physician Assistant

## 2018-08-06 VITALS — BP 126/74 | HR 75 | Wt 113.0 lb

## 2018-08-06 DIAGNOSIS — K6289 Other specified diseases of anus and rectum: Secondary | ICD-10-CM

## 2018-08-06 DIAGNOSIS — K921 Melena: Secondary | ICD-10-CM | POA: Diagnosis not present

## 2018-08-06 DIAGNOSIS — K5909 Other constipation: Secondary | ICD-10-CM

## 2018-08-06 DIAGNOSIS — Z0000 Encounter for general adult medical examination without abnormal findings: Secondary | ICD-10-CM | POA: Diagnosis not present

## 2018-08-06 MED ORDER — NONFORMULARY OR COMPOUNDED ITEM: 0 refills | Status: DC

## 2018-08-06 MED ORDER — DOCUSATE SODIUM 50 MG PO CAPS: 50.0000 mg | ORAL_CAPSULE | Freq: Every day | ORAL | 0 refills | Status: DC

## 2018-08-06 NOTE — Progress Notes (Signed)
HPI:                                                                Julie Harding is a 58 y.o. female who presents to [facility]: [place] today for annual physical exam  Current concerns:  She has been having recurrent painful rectal bleeding for several months. She reports she has a bowel movement every 2-3 days, stools are firm and hard to pass. She has tried OTC stool softeners in the past, but she has a history of fecal urgency and incontinence, especially with running long distances. She has a history of bowel endometriosis, which was treated.  Her colonoscopy is UTD, pathology showed benign colonic mucosa in March 2019  Depression screen [facility] 2/9 08/06/2018 02/12/2018 10/16/2017 08/22/2017 08/04/2017  Decreased Interest 1 1 3 2 2   Down, Depressed, Hopeless 1 2 1 2 3   PHQ - 2 Score 2 3 4 4 5   Altered sleeping 0 2 3 3 3   Tired, decreased energy 0 0 3 2 3   Change in appetite 0 0 3 0 1  Feeling bad or failure about yourself  0 1 1 2 2   Trouble concentrating 0 1 1 1 2   Moving slowly or fidgety/restless 0 0 0 0 0  Suicidal thoughts 0 0 0 0 0  PHQ-9 Score 2 7 15 12 16   Difficult doing work/chores - Not difficult at all - Somewhat difficult -    GAD 7 : Generalized Anxiety Score 08/06/2018 02/12/2018 10/16/2017  Nervous, Anxious, on Edge 0 0 0  Control/stop worrying - 0 0  Worry too much - different things 1 0 1  Trouble relaxing 0 0 0  Restless 0 0 0  Easily annoyed or irritable 1 1 2   Afraid - awful might happen 0 0 0  Total GAD 7 Score - 1 3  Anxiety Difficulty - Not difficult at all -      Past Medical History:  Diagnosis Date  . Acute deep vein thrombosis (DVT) of femoral vein of left lower extremity ([place]) 08/04/2017   DVT 2014 -  Idiopathic etiology this was several years ago.   Patient took 3 months of anticoagulation medications.  Marland Kitchen DVT (deep venous thrombosis) ([place]) 2015   Right leg  . Endometrioma 2008   found by colonoscopy    . Family history of cancer   . Genetic testing 12/01/2017   Common Cancers panel (47 genes) @ [facility] - No pathogenic mutations detected  . Pulmonary embolus ([place]) 08/2016  . Uterine fibroid    Past Surgical History:  Procedure Laterality Date  . COLONOSCOPY    . FLEXIBLE SIGMOIDOSCOPY N/A 11/20/2017   Procedure: FLEXIBLE SIGMOIDOSCOPY;  Surgeon: Clarene Essex, MD;  Location: [facility];  Service: Endoscopy;  Laterality: N/A;  . HOT HEMOSTASIS N/A 11/20/2017   Procedure: HOT HEMOSTASIS (ARGON PLASMA COAGULATION/BICAP);  Surgeon: Clarene Essex, MD;  Location: [facility];  Service: Endoscopy;  Laterality: N/A;  . LAPAROSCOPIC SUPRACERVICAL HYSTERECTOMY  2007   Leiomyoma  . ROTATOR CUFF REPAIR    . THUMB ARTHROSCOPY    . TUBAL LIGATION     Social History   Tobacco Use  . Smoking status: Never Smoker  . Smokeless tobacco: Never Used  Substance  Use Topics  . Alcohol use: Yes    Alcohol/week: 7.0 standard drinks    Types: 7 Standard drinks or equivalent per week   family history includes Breast cancer in her cousin and cousin; Breast cancer (age of onset: 79) in her maternal aunt; COPD in her mother; Colon cancer (age of onset: 19) in her maternal aunt; Diabetes in her brother; Heart disease in her brother; Hypertension in her father; Prostate cancer (age of onset: 74) in her father; Stroke in her brother.    ROS: Review of Systems  Gastrointestinal: Positive for blood in stool and constipation.  All other systems reviewed and are negative.    Medications: Current Outpatient Medications  Medication Sig Dispense Refill  . beta carotene 10000 UNIT capsule Take 50,000 Units by mouth daily.    . Biotin 10000 MCG TBDP Take 10,000 mcg by mouth daily.    . Black Cohosh 540 MG CAPS Take 540 mg by mouth daily.     . Calcium Carbonate-Vitamin D 600-400 MG-UNIT tablet Take 1 tablet by mouth 2 (two) times daily. 60 tablet 11  . docusate sodium (COLACE) 50 MG capsule Take 1 capsule (50 mg  total) by mouth daily. 10 capsule 0  . Lactobacillus (ACIDOPHILUS PROBIOTIC PO) Take 1 capsule by mouth 2 (two) times daily.    Marland Kitchen loratadine (CLARITIN) 10 MG tablet Take 10 mg by mouth daily.    . NONFORMULARY OR COMPOUNDED ITEM Diltiazem 1% and lidocaine cream. Apply rectally nightly 1 each 0  . RASPBERRY KETONES PO Take 500 mg by mouth daily.    . rivaroxaban (XARELTO) 10 MG TABS tablet Take 1 tablet (10 mg total) by mouth daily. 90 tablet 1  . Turmeric Curcumin 500 MG CAPS Take 500 mg by mouth daily.     No current facility-administered medications for this visit.    Allergies  Allergen Reactions  . Estrogens Other (See Comments)    VTE  . Eliquis [Apixaban] Other (See Comments)    Myalgia, weight gain  . Vicodin [Hydrocodone-Acetaminophen] Nausea And Vomiting       Objective:  BP 126/74   Pulse 75   Wt 113 lb (51.3 kg)   BMI 20.67 kg/m  General Appearance:  Alert, cooperative, no distress, appropriate for age                            Head:  Normocephalic, without obvious abnormality                             Eyes:  PERRL, EOM's intact, conjunctiva and cornea clear                             Ears:  TM pearly gray color and semitransparent, external ear canals normal, both ears                            Nose:  Nares symmetrical, mucosa pink                          Throat:  Lips, tongue, and mucosa are moist, pink, and intact; oropharynx clear, uvula midline; good dentition  Neck:  Supple; symmetrical, trachea midline, no adenopathy; thyroid: no enlargement, symmetric, no tenderness/mass/nodules                             Back:  Symmetrical, no curvature, ROM normal               Chest/Breast:  deferred                           Lungs:  Clear to auscultation bilaterally, respirations unlabored                             Heart:  normal rate & regular rhythm, S1 and S2 normal, no murmurs, rubs, or gallops                     Abdomen:  Soft,  non-tender, no mass or organomegaly              Genitourinary:  Deferred per patient         Musculoskeletal:  Tone and strength strong and symmetrical, all extremities; no joint pain or edema, normal gait and station                                     Lymphatic:  No adenopathy             Skin/Hair/Nails:  Skin warm, dry and intact, no rashes or abnormal dyspigmentation on limited exam                   Neurologic:  Alert and oriented x3, no cranial nerve deficits, sensation grossly intact, normal gait and station, no tremor Psych: well-groomed, cooperative, good eye contact, euthymic mood, affect mood-congruent, speech is articulate, and thought processes clear and goal-directed   Lab Results  Component Value Date   CREATININE 0.76 04/13/2018   BUN 15 04/13/2018   NA 140 04/13/2018   K 4.0 04/13/2018   CL 106 04/13/2018   CO2 27 04/13/2018   Lab Results  Component Value Date   ALT 16 04/13/2018   AST 21 04/13/2018   ALKPHOS 78 08/10/2017   BILITOT 1.0 04/13/2018   Lab Results  Component Value Date   TSH 1.85 04/13/2018   Lab Results  Component Value Date   CHOL 188 08/10/2017   HDL 70 08/10/2017   LDLCALC 98 08/10/2017   TRIG 99 08/10/2017   CHOLHDL 2.7 08/10/2017   The 10-year ASCVD risk score Mikey Bussing DC Jr., et al., 2013) is: 1.4%   Values used to calculate the score:     Age: 85 years     Sex: Female     Is Non-Hispanic African American: No     Diabetic: No     Tobacco smoker: No     Systolic Blood Pressure: 938 mmHg     Is BP treated: No     HDL Cholesterol: 70 mg/dL     Total Cholesterol: 188 mg/dL   No results found for this or any previous visit (from the past 72 hour(s)). No results found.    Assessment and Plan: 58 y.o. female with   .[place] was seen today for establish care.  Diagnoses and all orders for this visit:  Encounter for annual physical exam  Hematochezia -  NONFORMULARY OR COMPOUNDED ITEM; Diltiazem 1% and lidocaine cream.  Apply rectally nightly  Rectal pain -     NONFORMULARY OR COMPOUNDED ITEM; Diltiazem 1% and lidocaine cream. Apply rectally nightly  Chronic constipation -     docusate sodium (COLACE) 50 MG capsule; Take 1 capsule (50 mg total) by mouth daily.   - Personally reviewed PMH, PSH, PFH, medications, allergies, HM - Age-appropriate cancer screening: mammogram due 11/2018, Pap due 09/2022 - Influenza declined - Tdap UTD - PHQ2 negative  COUNSELING: - Self breast awareness encouraged.  - Recommend annual mammogram - Calcium 1200mg  daily and Vitamin D 1000-2000 IU daily encouraged. - Sunscreen use/skin check encouraged. - Healthy diet and routine exercise encouraged. - Tobacco and drug avoidance encouraged  We will treat painful hematochezia in the setting of constipation as potential anal fissure conservatively with stool softeners and rectal diltiazem. Close follow-up in 1 month. If symptoms persist, will refer back to GI.   Patient education and anticipatory guidance given Patient agrees with treatment plan Follow-up in 1 month or sooner as needed if symptoms worsen or fail to improve  Darlyne Russian PA-C

## 2018-08-06 NOTE — Patient Instructions (Signed)
Anal Fissure, Adult    An anal fissure is a small tear or crack in the tissue of the anus. Bleeding from a fissure usually stops on its own within a few minutes. However, bleeding will often occur again with each bowel movement until the fissure heals.  What are the causes?  This condition is usually caused by passing a large or hard stool (feces). Other causes include:   Constipation.   Frequent diarrhea.   Inflammatory bowel disease (Crohn's disease or ulcerative colitis).   Childbirth.   Infections.   Anal sex.  What are the signs or symptoms?  Symptoms of this condition include:   Bleeding from the rectum.   Small amounts of blood seen on your stool, on the toilet paper, or in the toilet after a bowel movement. The blood coats the outside of the stool and is not mixed with the stool.   Painful bowel movements.   Itching or irritation around the anus.  How is this diagnosed?  A health care provider may diagnose this condition by closely examining the anal area. An anal fissure can usually be seen with careful inspection. In some cases, a rectal exam may be performed, or a short tube (anoscope) may be used to examine the anal canal.  How is this treated?  Initial treatment for this condition may include:   Taking steps to avoid constipation. This may include making changes to your diet, such as increasing your intake of fiber or fluid.   Taking fiber supplements. These supplements can soften your stool to help make bowel movements easier. Your health care provider may also prescribe a stool softener if your stool is hard.   Taking sitz baths. This may help to heal the tear.   Using medicated creams or ointments. These may be prescribed to lessen discomfort.  Treatments that are sometimes used if initial treatments do not work well or if the condition is more severe may include:   Botulinum injection.   Surgery to repair the fissure.  Follow these instructions at home:  Eating and  drinking     Avoid foods that may cause constipation, such as bananas, milk, and other dairy products.   Eat all fruits, except bananas.   Drink enough fluid to keep your urine pale yellow.   Eat foods that are high in fiber, such as beans, whole grains, and fresh fruits and vegetables.  General instructions     Take over-the-counter and prescription medicines only as told by your health care provider.   Use creams or ointments only as told by your health care provider.   Keep the anal area clean and dry.   Take sitz baths as told by your health care provider. Do not use soap in the sitz baths.   Keep all follow-up visits as told by your health care provider. This is important.  Contact a health care provider if you have:   More bleeding.   A fever.   Diarrhea that is mixed with blood.   Pain that continues.   Ongoing problems that are getting worse rather than better.  Summary   An anal fissure is a small tear or crack in the tissue of the anus. This condition is usually caused by passing a large or hard stool (feces). Other causes include constipation and frequent diarrhea.   Initial treatment for this condition may include taking steps to avoid constipation, such as increasing your intake of fiber or fluid.   Follow instructions for care   as told by your health care provider.   Contact your health care provider if you have more bleeding or your problem is getting worse rather than better.   Keep all follow-up visits as told by your health care provider. This is important.  This information is not intended to replace advice given to you by your health care provider. Make sure you discuss any questions you have with your health care provider.  Document Released: 07/11/2005 Document Revised: 12/21/2017 Document Reviewed: 12/21/2017  Elsevier Interactive Patient Education  2019 [employer] Inc[employer]

## 2018-08-11 DIAGNOSIS — K921 Melena: Secondary | ICD-10-CM | POA: Insufficient documentation

## 2018-08-11 DIAGNOSIS — K6289 Other specified diseases of anus and rectum: Secondary | ICD-10-CM | POA: Insufficient documentation

## 2018-08-11 DIAGNOSIS — K5909 Other constipation: Secondary | ICD-10-CM | POA: Insufficient documentation

## 2018-08-23 DIAGNOSIS — Z23 Encounter for immunization: Secondary | ICD-10-CM | POA: Diagnosis not present

## 2018-09-03 ENCOUNTER — Encounter: Payer: Self-pay | Admitting: Physician Assistant

## 2018-09-03 ENCOUNTER — Ambulatory Visit: Payer: 59 | Admitting: Physician Assistant

## 2018-09-03 VITALS — BP 122/81 | HR 84 | Wt 117.0 lb

## 2018-09-03 DIAGNOSIS — Z780 Asymptomatic menopausal state: Secondary | ICD-10-CM | POA: Diagnosis not present

## 2018-09-03 DIAGNOSIS — K625 Hemorrhage of anus and rectum: Secondary | ICD-10-CM | POA: Diagnosis not present

## 2018-09-03 DIAGNOSIS — Z23 Encounter for immunization: Secondary | ICD-10-CM | POA: Diagnosis not present

## 2018-09-03 DIAGNOSIS — M2580 Other specified joint disorders, unspecified joint: Secondary | ICD-10-CM | POA: Diagnosis not present

## 2018-09-03 DIAGNOSIS — I82401 Acute embolism and thrombosis of unspecified deep veins of right lower extremity: Secondary | ICD-10-CM

## 2018-09-03 DIAGNOSIS — R635 Abnormal weight gain: Secondary | ICD-10-CM

## 2018-09-03 MED ORDER — TOPIRAMATE 50 MG PO TABS: 50.0000 mg | ORAL_TABLET | Freq: Two times a day (BID) | ORAL | 3 refills | Status: DC

## 2018-09-03 MED ORDER — NONFORMULARY OR COMPOUNDED ITEM: 0 refills | Status: DC

## 2018-09-03 NOTE — Patient Instructions (Addendum)
When traveling to prevent blood clot... Wear knee high compression stockings sitting in an exit row or bulkhead seat to increase legroom Extend your legs straight out in front and flex your ankles. Pull up and spread your toes, then push down and curl your toes. Repeat for 10 times. Remove your shoes if necessary. If there isn't room to extend your legs, start with your feet flat on the floor and push down and curl your toes while lifting your heels from the floor. Then, with your heels back on the floor, lift and spread your toes. Repeat 10 times. Exercise your thigh muscles by sitting with your feet flat on the floor and sliding your feet forward a few inches, then sliding them back. Repeat 10 times.  For rectal bleeding Schedule a follow-up appointment with your GI doctor Continue stool softener  For hip bursitis Get records from Orthopedist Schedule a follow-up appt with Dr. Darene Lamer

## 2018-09-03 NOTE — Progress Notes (Signed)
HPI:                                                                Julie Harding is a 58 y.o. female who presents to [facility] today for multiple concerns  Rectal bleeding - bleeding recurred Jan30-Feb2. This time bleeding was painless. She has been using stool softeners regularly and states stool has not been difficult to pass.  R hip bursitis - reports she had 2 hip injections in 2017, did not get any relief. States it hurts to lay on that side and it interferes with her sleep. Hurts with stair climbing. Has tried Tylenol arthritis up to 1300 mg without any improvement. She also took 2 Naproxen, but she is on lifelong Xarelto.  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 Denies pain in the ball of her foot. She is wondering if she needs any treatment for this  Hot flashes, fatigue, weight gain and depressed mood are also a concern. Reports hot flashes are better than they were in the past. She has them less frequently. She had two provoked VTE's while on oral estrogen and is now on lifelong anticoagulation with Xarelto. She has taken Phentermine and Topamax in the past for weight gian. Since stopping these medications several months ago, weight has been gradually increasing. She is concerned symptoms could be due to thyroid disease. TSH was normal 3 months ago.  She would like the shingles vaccine. Denies prior hx of shingles or chickenpox.   Past Medical History:  Diagnosis Date  . Acute deep vein thrombosis (DVT) of femoral vein of left lower extremity ([place]) 08/04/2017   DVT 2014 -  Idiopathic etiology this was several years ago.   Patient took 3 months of anticoagulation medications.  Marland Kitchen DVT (deep venous thrombosis) ([place]) 2015   Right leg  . Endometrioma 2008   found by colonoscopy   . Family history of cancer   . Genetic testing 12/01/2017   Common Cancers panel (47 genes) @ [facility] - No pathogenic mutations  detected  . Pulmonary embolus ([place]) 08/2016  . Uterine fibroid    Past Surgical History:  Procedure Laterality Date  . COLONOSCOPY    . FLEXIBLE SIGMOIDOSCOPY N/A 11/20/2017   Procedure: FLEXIBLE SIGMOIDOSCOPY;  Surgeon: Clarene Essex, MD;  Location: [facility];  Service: Endoscopy;  Laterality: N/A;  . HOT HEMOSTASIS N/A 11/20/2017   Procedure: HOT HEMOSTASIS (ARGON PLASMA COAGULATION/BICAP);  Surgeon: Clarene Essex, MD;  Location: [facility];  Service: Endoscopy;  Laterality: N/A;  . LAPAROSCOPIC SUPRACERVICAL HYSTERECTOMY  2007   Leiomyoma  . ROTATOR CUFF REPAIR    . THUMB ARTHROSCOPY    . TUBAL LIGATION     Social History   Tobacco Use  . Smoking status: Never Smoker  . Smokeless tobacco: Never Used  Substance Use Topics  . Alcohol use: Yes    Alcohol/week: 7.0 standard drinks    Types: 7 Standard drinks or equivalent per week   family history includes Breast cancer in her cousin and cousin; Breast cancer (age of onset: 34) in her maternal aunt; COPD in her mother; Colon cancer (age of onset: 39) in her maternal aunt; Diabetes in her brother; Heart disease in her brother; Hypertension in her father; Prostate  cancer (age of onset: 71) in her father; Stroke in her brother.    ROS: negative except as noted in the HPI  Medications: Current Outpatient Medications  Medication Sig Dispense Refill  . beta carotene 10000 UNIT capsule Take 50,000 Units by mouth daily.    . Biotin 10000 MCG TBDP Take 10,000 mcg by mouth daily.    . Black Cohosh 540 MG CAPS Take 540 mg by mouth daily.     . Calcium Carbonate-Vitamin D 600-400 MG-UNIT tablet Take 1 tablet by mouth 2 (two) times daily. 60 tablet 11  . docusate sodium (COLACE) 50 MG capsule Take 1 capsule (50 mg total) by mouth daily. 10 capsule 0  . Lactobacillus (ACIDOPHILUS PROBIOTIC PO) Take 1 capsule by mouth 2 (two) times daily.    Marland Kitchen loratadine (CLARITIN) 10 MG tablet Take 10 mg by mouth daily.    . NONFORMULARY OR COMPOUNDED ITEM  Diltiazem 1% and lidocaine cream. Apply rectally nightly 1 each 0  . NONFORMULARY OR COMPOUNDED ITEM Knee high medium grade compression stockings. Apply to both legs daily. Remove at night 2 each 0  . RASPBERRY KETONES PO Take 500 mg by mouth daily.    . rivaroxaban (XARELTO) 10 MG TABS tablet Take 1 tablet (10 mg total) by mouth daily. 90 tablet 1  . topiramate (TOPAMAX) 50 MG tablet Take 1 tablet (50 mg total) by mouth 2 (two) times daily. 60 tablet 3  . Turmeric Curcumin 500 MG CAPS Take 500 mg by mouth daily.     No current facility-administered medications for this visit.    Allergies  Allergen Reactions  . Estrogens Other (See Comments)    VTE  . Eliquis [Apixaban] Other (See Comments)    Myalgia, weight gain  . Vicodin [Hydrocodone-Acetaminophen] Nausea And Vomiting       Objective:  BP 122/81   Pulse 84   Wt 117 lb (53.1 kg)   BMI 21.40 kg/m  Gen:  alert, not ill-appearing, no distress, appropriate for age 16: head normocephalic without obvious abnormality, conjunctiva and cornea clear, trachea midline Pulm: Normal work of breathing, normal phonation Neuro: alert and oriented x 3, no tremor MSK: extremities atraumatic, normal gait and station Skin: intact, no rashes on exposed skin, no jaundice, no cyanosis Psych: well-groomed, cooperative, good eye contact, euthymic mood, affect mood-congruent, speech is articulate, and thought processes clear and goal-directed    No results found for this or any previous visit (from the past 72 hour(s)). No results found.    Assessment and Plan: 58 y.o. female with   .Julie Harding was seen today for follow-up.  Diagnoses and all orders for this visit:  Postmenopausal estrogen deficiency  Deep vein thrombosis (DVT) of right lower extremity, unspecified chronicity, unspecified vein (HCC) -     NONFORMULARY OR COMPOUNDED ITEM; Knee high medium grade compression stockings. Apply to both legs daily. Remove at  night  Sesamoiditis  Abnormal weight gain -     topiramate (TOPAMAX) 50 MG tablet; Take 1 tablet (50 mg total) by mouth 2 (two) times daily.  Need for shingles vaccine -     Varicella-zoster vaccine IM   Postmenopausal estrogen deficiency Due to history of provoked VTE x 2 related to oral estradiol, hormone replacement therapy is a relative contraindication. We discussed non-hormonal management of vasomotor symptoms and pharmacologic options would include Gabapentin or Effexor  Abnormal weight gain Wt Readings from Last 3 Encounters:  09/03/18 117 lb (53.1 kg)  08/06/18 113 lb (51.3 kg)  04/13/18  115 lb (52.2 kg)  BMI is within a normal range, has gained about 4 pounds in the last month and is very concerned about this trending upward Would like to restart Topamax  Sesamoiditis, congenital bipartite Asymptomatic Optional custom orthotics  Rectal bleeding Needs to follow-up with her GI. She will schedule this asap  Patient education and anticipatory guidance given Patient agrees with treatment plan Follow-up as needed if symptoms worsen or fail to improve  I spent 40 minutes with this patient, greater than 50% was face-to-face time counseling regarding the above diagnoses  Darlyne Russian PA-C

## 2018-09-06 DIAGNOSIS — Z23 Encounter for immunization: Secondary | ICD-10-CM | POA: Diagnosis not present

## 2018-09-11 ENCOUNTER — Telehealth: Payer: Self-pay | Admitting: Physician Assistant

## 2018-09-11 DIAGNOSIS — Z7901 Long term (current) use of anticoagulants: Secondary | ICD-10-CM | POA: Insufficient documentation

## 2018-09-11 DIAGNOSIS — G8929 Other chronic pain: Secondary | ICD-10-CM

## 2018-09-11 DIAGNOSIS — M25551 Pain in right hip: Principal | ICD-10-CM

## 2018-09-11 MED ORDER — TRAMADOL HCL 50 MG PO TABS: 50.0000 mg | ORAL_TABLET | Freq: Every evening | ORAL | 2 refills | Status: DC | PRN

## 2018-09-11 NOTE — Telephone Encounter (Signed)
Spoke with patient in follow-up of multiple issues 1. Vasomotor symptoms - explained that non-hormonal pharmacologic options should be exhausted before considering the risk of hormone therapy. Discussed options including Gabapentin and Venlafaxine. She is concerned about weight gain and does not want to risk this. She plans to continue coping with the hot flashes without medication. 2. Right great toe pain - not likely related to her congenital bipartite sesamoid as pain is on the dorsum of her foot and not on the ball of her foot. Recommended she have foot pain evaluated by her Orthopedist or Dr. Darene Lamer 3. R hip pain - has been taking a leftover prescription of Tramadol and states this has been helping with pain and she is able to sleep approx 5 hours. Would like a refill of this. PDMP reviewed. Refill sent. She can take Tylenol Arthritis strength every 8 hours prn for daytime pain.  Also recommended she try PT or chiropractic care

## 2018-10-04 DIAGNOSIS — Z23 Encounter for immunization: Secondary | ICD-10-CM | POA: Diagnosis not present

## 2018-10-19 ENCOUNTER — Other Ambulatory Visit: Payer: Self-pay

## 2018-10-19 MED ORDER — RIVAROXABAN 10 MG PO TABS: 10.0000 mg | ORAL_TABLET | Freq: Every day | ORAL | 1 refills | Status: DC

## 2018-12-31 ENCOUNTER — Other Ambulatory Visit: Payer: Self-pay | Admitting: Physician Assistant

## 2018-12-31 DIAGNOSIS — R635 Abnormal weight gain: Secondary | ICD-10-CM

## 2019-04-17 ENCOUNTER — Other Ambulatory Visit: Payer: Self-pay | Admitting: Physician Assistant

## 2019-04-23 ENCOUNTER — Encounter: Payer: Self-pay | Admitting: Gynecology

## 2019-10-08 ENCOUNTER — Ambulatory Visit (INDEPENDENT_AMBULATORY_CARE_PROVIDER_SITE_OTHER): Payer: 59 | Admitting: Family Medicine

## 2019-10-08 ENCOUNTER — Ambulatory Visit (INDEPENDENT_AMBULATORY_CARE_PROVIDER_SITE_OTHER): Payer: 59

## 2019-10-08 ENCOUNTER — Other Ambulatory Visit: Payer: Self-pay

## 2019-10-08 ENCOUNTER — Encounter: Payer: Self-pay | Admitting: Family Medicine

## 2019-10-08 VITALS — BP 133/82 | HR 80 | Temp 97.7°F | Ht 62.0 in | Wt 124.0 lb

## 2019-10-08 DIAGNOSIS — M546 Pain in thoracic spine: Secondary | ICD-10-CM

## 2019-10-08 DIAGNOSIS — R635 Abnormal weight gain: Secondary | ICD-10-CM

## 2019-10-08 DIAGNOSIS — I2699 Other pulmonary embolism without acute cor pulmonale: Secondary | ICD-10-CM

## 2019-10-08 DIAGNOSIS — R0602 Shortness of breath: Secondary | ICD-10-CM

## 2019-10-08 DIAGNOSIS — Z5181 Encounter for therapeutic drug level monitoring: Secondary | ICD-10-CM

## 2019-10-08 DIAGNOSIS — Z7901 Long term (current) use of anticoagulants: Secondary | ICD-10-CM

## 2019-10-08 NOTE — Patient Instructions (Signed)
Great to meet you today! Please have labs and xray completed. We'll be in touch with results.   If all looks good, I'll get phentermine sent over for you.

## 2019-10-09 DIAGNOSIS — M546 Pain in thoracic spine: Secondary | ICD-10-CM | POA: Insufficient documentation

## 2019-10-09 LAB — CBC
HCT: 42.7 % (ref 35.0–45.0)
Hemoglobin: 14.4 g/dL (ref 11.7–15.5)
MCH: 31.9 pg (ref 27.0–33.0)
MCHC: 33.7 g/dL (ref 32.0–36.0)
MCV: 94.5 fL (ref 80.0–100.0)
MPV: 8.9 fL (ref 7.5–12.5)
Platelets: 239 10*3/uL (ref 140–400)
RBC: 4.52 10*6/uL (ref 3.80–5.10)
RDW: 11.4 % (ref 11.0–15.0)
WBC: 6.2 10*3/uL (ref 3.8–10.8)

## 2019-10-09 LAB — BASIC METABOLIC PANEL
BUN: 17 mg/dL (ref 7–25)
CO2: 29 mmol/L (ref 20–32)
Calcium: 10.1 mg/dL (ref 8.6–10.4)
Chloride: 103 mmol/L (ref 98–110)
Creat: 0.61 mg/dL (ref 0.50–1.05)
Glucose, Bld: 91 mg/dL (ref 65–139)
Potassium: 4.4 mmol/L (ref 3.5–5.3)
Sodium: 138 mmol/L (ref 135–146)

## 2019-10-09 LAB — D-DIMER, QUANTITATIVE (NOT AT ARMC): D-Dimer, Quant: <0.19 mcg/mL FEU (ref <0.50)

## 2019-10-09 MED ORDER — PHENTERMINE HCL 37.5 MG PO TABS: ORAL_TABLET | ORAL | 0 refills | Status: DC

## 2019-10-09 NOTE — Progress Notes (Signed)
Julie Harding - 59 y.o. female MRN [id number]  Date of birth: 12-13-1960  Subjective No chief complaint on file.   HPI Julie Harding is a 59 y.o. female with history of DVT and PE and chronic hip pain here today with complaint of back pain.  She reports that she has had pain in upper back around R shoulder blade.  She is very active as a runner and also rows frequently.  She has pain with these activities.  She also feels shortness of breath at times and feels like she can't take a full deep breath.  This does not feel like her previous PE and she is taking xarelto as directed.  She has tried ibuprofen, icing and heat without much relief so far.    She is also interested in help with losing weight.  She has taken phentermine in the past to get her started on weight loss and this has worked very well   ROS:  A comprehensive ROS was completed and negative except as noted per HPI  Allergies  Allergen Reactions  . Estrogens Other (See Comments)    VTE  . Eliquis [Apixaban] Other (See Comments)    Myalgia, weight gain  . Vicodin [Hydrocodone-Acetaminophen] Nausea And Vomiting    Past Medical History:  Diagnosis Date  . Acute deep vein thrombosis (DVT) of femoral vein of left lower extremity ([place]) 08/04/2017   DVT 2014 -  Idiopathic etiology this was several years ago.   Patient took 3 months of anticoagulation medications.  Marland Kitchen DVT (deep venous thrombosis) ([place]) 2015   Right leg  . Endometrioma 2008   found by colonoscopy   . Family history of cancer   . Genetic testing 12/01/2017   Common Cancers panel (47 genes) @ [facility] - No pathogenic mutations detected  . Pulmonary embolus ([place]) 08/2016  . Uterine fibroid     Past Surgical History:  Procedure Laterality Date  . COLONOSCOPY    . FLEXIBLE SIGMOIDOSCOPY N/A 11/20/2017   Procedure: FLEXIBLE SIGMOIDOSCOPY;  Surgeon: Clarene Essex, MD;  Location: [facility];  Service: Endoscopy;  Laterality: N/A;  . HOT HEMOSTASIS N/A 11/20/2017    Procedure: HOT HEMOSTASIS (ARGON PLASMA COAGULATION/BICAP);  Surgeon: Clarene Essex, MD;  Location: [facility];  Service: Endoscopy;  Laterality: N/A;  . LAPAROSCOPIC SUPRACERVICAL HYSTERECTOMY  2007   Leiomyoma  . ROTATOR CUFF REPAIR    . THUMB ARTHROSCOPY    . TUBAL LIGATION      Social History   Socioeconomic History  . Marital status: Married    Spouse name: Not on file  . Number of children: Not on file  . Years of education: Not on file  . Highest education level: Not on file  Occupational History  . Not on file  Tobacco Use  . Smoking status: Never Smoker  . Smokeless tobacco: Never Used  Substance and Sexual Activity  . Alcohol use: Yes    Alcohol/week: 7.0 standard drinks    Types: 7 Standard drinks or equivalent per week  . Drug use: No  . Sexual activity: Yes    Birth control/protection: Surgical    Comment: 1st intercourse 59 yo-fewer than 5 partners-HYST  Other Topics Concern  . Not on file  Social History Narrative  . Not on file   Social Determinants of Health   Financial Resource Strain:   . Difficulty of Paying Living Expenses:   Food Insecurity:   . Worried About Charity fundraiser in the Last Year:   .  Ran Out of Food in the Last Year:   Transportation Needs:   . Film/video editor (Medical):   Marland Kitchen Lack of Transportation (Non-Medical):   Physical Activity:   . Days of Exercise per Week:   . Minutes of Exercise per Session:   Stress:   . Feeling of Stress :   Social Connections:   . Frequency of Communication with Friends and Family:   . Frequency of Social Gatherings with Friends and Family:   . Attends Religious Services:   . Active Member of Clubs or Organizations:   . Attends Archivist Meetings:   Marland Kitchen Marital Status:     Family History  Problem Relation Age of Onset  . COPD Mother   . Prostate cancer Father 77       also throat ca; deceased 43  . Hypertension Father   . Heart disease Brother   . Diabetes Brother    . Stroke Brother   . Breast cancer Maternal Aunt 80  . Colon cancer Maternal Aunt 80       currently 93  . Breast cancer Cousin        dx 62s; daughter of mat aunt with cancer  . Breast cancer Cousin        dx 70s; daughter of mat aunt with cancer    Health Maintenance  Topic Date Due  . Hepatitis C Screening  Never done  . HIV Screening  Never done  . INFLUENZA VACCINE  10/23/2019 (Originally 02/23/2019)  . MAMMOGRAM  12/22/2019  . PAP SMEAR-Modifier  10/17/2022  . COLONOSCOPY  10/03/2027  . TETANUS/TDAP  01/23/2028     ----------------------------------------------------------------------------------------------------------------------------------------------------------------------------------------------------------------- Physical Exam BP 133/82   Pulse 80   Temp 97.7 F (36.5 C) (Oral)   Ht 5\' 2"  (1.575 m)   Wt 124 lb (56.2 kg)   BMI 22.68 kg/m   Physical Exam Constitutional:      Appearance: Normal appearance.  HENT:     Head: Normocephalic and atraumatic.  Eyes:     General: No scleral icterus. Cardiovascular:     Rate and Rhythm: Normal rate and regular rhythm.  Pulmonary:     Effort: Pulmonary effort is normal.     Breath sounds: Normal breath sounds.  Musculoskeletal:     Cervical back: Neck supple.     Comments: TTP along medial border of scapula.  ROM of shoulder is normal without pain.  No scapular winging noted  Skin:    General: Skin is warm and dry.  Neurological:     General: No focal deficit present.     Mental Status: She is alert.  Psychiatric:        Mood and Affect: Mood normal.        Behavior: Behavior normal.     ------------------------------------------------------------------------------------------------------------------------------------------------------------------------------------------------------------------- Assessment and Plan  Chronic anticoagulation She is doing well with xarelto.  Update BMP and CBC today.    Acute right-sided thoracic back pain She is having some associated shortness of breath but I think this is more likely from spasm.  CXR ordered.  It is unlikely that she would have a PE on xarelto however will check D-dimer.  If D-dimer and CXR negative we discussed having her follow up with Dr. Dianah Field   Abnormal weight gain She has had good success with phentermine previously.  We discussed restarting this if all is looking well on labs and negative D- dimer.    No orders of the defined types were placed in this encounter.  No follow-ups on file.    This visit occurred during the SARS-CoV-2 public health emergency.  Safety protocols were in place, including screening questions prior to the visit, additional usage of staff PPE, and extensive cleaning of exam room while observing appropriate contact time as indicated for disinfecting solutions.

## 2019-10-09 NOTE — Assessment & Plan Note (Signed)
She has had good success with phentermine previously.  We discussed restarting this if all is looking well on labs and negative D- dimer.

## 2019-10-09 NOTE — Assessment & Plan Note (Signed)
She is doing well with xarelto.  Update BMP and CBC today.

## 2019-10-09 NOTE — Assessment & Plan Note (Signed)
She is having some associated shortness of breath but I think this is more likely from spasm.  CXR ordered.  It is unlikely that she would have a PE on xarelto however will check D-dimer.  If D-dimer and CXR negative we discussed having her follow up with Dr. Dianah Field

## 2019-10-10 ENCOUNTER — Ambulatory Visit: Payer: 59 | Attending: Internal Medicine

## 2019-10-10 DIAGNOSIS — Z23 Encounter for immunization: Secondary | ICD-10-CM

## 2019-10-10 NOTE — Progress Notes (Signed)
   Covid-19 Vaccination Clinic  Name:  YAJAHIRA HEADLEE    MRN: [id number] DOB: 1961/04/14  10/10/2019  Ms. Milburn was observed post Covid-19 immunization for 15 minutes without incident. She was provided with Vaccine Information Sheet and instruction to access the V-Safe system.   Ms. Bough was instructed to call 911 with any severe reactions post vaccine: Marland Kitchen Difficulty breathing  . Swelling of face and throat  . A fast heartbeat  . A bad rash all over body  . Dizziness and weakness   Immunizations Administered    Name Date Dose VIS Date Route   Pfizer COVID-19 Vaccine 10/10/2019 10:55 AM 0.3 mL 07/05/2019 Intramuscular   Manufacturer: [place]   Lot: [id number]   [place]: [id number]

## 2019-10-16 ENCOUNTER — Encounter: Payer: Self-pay | Admitting: Family Medicine

## 2019-10-21 ENCOUNTER — Other Ambulatory Visit: Payer: Self-pay | Admitting: Physician Assistant

## 2019-11-04 ENCOUNTER — Ambulatory Visit: Payer: 59 | Attending: Internal Medicine

## 2019-11-04 DIAGNOSIS — Z23 Encounter for immunization: Secondary | ICD-10-CM

## 2019-11-04 NOTE — Progress Notes (Signed)
   Covid-19 Vaccination Clinic  Name:  Julie Harding    MRN: [id number] DOB: [birth year]  11/04/2019  [name] was observed post Covid-19 immunization for 15 minutes without incident. She was provided with Vaccine Information Sheet and instruction to access the V-Safe system.   [name] was instructed to call 911 with any severe reactions post vaccine: Marland Kitchen Difficulty breathing  . Swelling of face and throat  . A fast heartbeat  . A bad rash all over body  . Dizziness and weakness   Immunizations Administered    Name Date Dose VIS Date Route   Pfizer COVID-19 Vaccine 11/04/2019 10:57 AM 0.3 mL 07/05/2019 Intramuscular   Manufacturer: [place]   Lot: [id number]   [place]: [id number]

## 2019-11-11 ENCOUNTER — Encounter: Payer: Self-pay | Admitting: Family Medicine

## 2019-11-11 ENCOUNTER — Other Ambulatory Visit: Payer: Self-pay | Admitting: Family Medicine

## 2019-11-11 DIAGNOSIS — R635 Abnormal weight gain: Secondary | ICD-10-CM

## 2019-11-11 MED ORDER — PHENTERMINE HCL 37.5 MG PO TABS: ORAL_TABLET | ORAL | 0 refills | Status: DC

## 2019-11-19 ENCOUNTER — Other Ambulatory Visit: Payer: Self-pay | Admitting: Sports Medicine

## 2019-12-17 ENCOUNTER — Encounter: Payer: Self-pay | Admitting: Family Medicine

## 2019-12-18 ENCOUNTER — Other Ambulatory Visit: Payer: Self-pay | Admitting: Family Medicine

## 2019-12-18 DIAGNOSIS — R635 Abnormal weight gain: Secondary | ICD-10-CM

## 2019-12-18 MED ORDER — TOPIRAMATE 50 MG PO TABS: 50.0000 mg | ORAL_TABLET | Freq: Two times a day (BID) | ORAL | 0 refills | Status: DC

## 2019-12-20 ENCOUNTER — Other Ambulatory Visit: Payer: Self-pay | Admitting: Family Medicine

## 2019-12-20 DIAGNOSIS — R635 Abnormal weight gain: Secondary | ICD-10-CM

## 2019-12-20 MED ORDER — PHENTERMINE HCL 37.5 MG PO TABS: ORAL_TABLET | ORAL | 0 refills | Status: DC

## 2020-04-20 ENCOUNTER — Other Ambulatory Visit: Payer: Self-pay | Admitting: Family Medicine

## 2020-07-15 ENCOUNTER — Encounter: Payer: Self-pay | Admitting: Family Medicine

## 2020-08-07 ENCOUNTER — Encounter: Payer: Self-pay | Admitting: Physician Assistant

## 2020-08-07 ENCOUNTER — Other Ambulatory Visit: Payer: Self-pay

## 2020-08-07 ENCOUNTER — Ambulatory Visit (INDEPENDENT_AMBULATORY_CARE_PROVIDER_SITE_OTHER): Payer: 59 | Admitting: Physician Assistant

## 2020-08-07 VITALS — BP 111/70 | HR 82 | Wt 123.0 lb

## 2020-08-07 DIAGNOSIS — R234 Changes in skin texture: Secondary | ICD-10-CM

## 2020-08-07 DIAGNOSIS — T148XXA Other injury of unspecified body region, initial encounter: Secondary | ICD-10-CM | POA: Diagnosis not present

## 2020-08-07 MED ORDER — CEPHALEXIN 500 MG PO CAPS: 500.0000 mg | ORAL_CAPSULE | Freq: Two times a day (BID) | ORAL | 0 refills | Status: DC

## 2020-08-07 MED ORDER — CLOBETASOL PROPIONATE 0.05 % EX CREA: 1.0000 "application " | TOPICAL_CREAM | Freq: Two times a day (BID) | CUTANEOUS | 2 refills | Status: DC

## 2020-08-07 NOTE — Progress Notes (Addendum)
Acute Office Visit  Subjective:    Patient ID: Julie Harding, female    DOB: 01/06/1961, 60 y.o.   MRN: [id number]  60 year old presents with cracked, dry wounds on skin over knuckles of L middle and R index fingers.  She states that around Thanksgiving she had some blisters form over these areas, and as they crusted over gradually she has had issues with them splitting and not healing properly.  The split wounds are very painful.    She works outside and rows extensively and washes her hands frequently.  She uses [employer] to [employer] but only once daily at night.  Over the last few days she used a topical anti-infective agent that her family uses for their horses on her hands as well.    Her blisters occur sporadically with no known triggers.  Her mother also has history of this condition.  She has been seen by both dermatology and endocrinology, and a clear etiology for this condition has yet to be established.  She also reports Raynaud's phenomena and that she keeps her hands gloved at all times when outside in cold temperatures.     Past Medical History:  Diagnosis Date  . Acute deep vein thrombosis (DVT) of femoral vein of left lower extremity ([place]) 08/04/2017   DVT 2014 -  Idiopathic etiology this was several years ago.   Patient took 3 months of anticoagulation medications.  Marland Kitchen DVT (deep venous thrombosis) ([place]) 2015   Right leg  . Endometrioma 2008   found by colonoscopy   . Family history of cancer   . Genetic testing 12/01/2017   Common Cancers panel (47 genes) @ [facility] - No pathogenic mutations detected  . Pulmonary embolus ([place]) 08/2016  . Uterine fibroid     Past Surgical History:  Procedure Laterality Date  . COLONOSCOPY    . FLEXIBLE SIGMOIDOSCOPY N/A 11/20/2017   Procedure: FLEXIBLE SIGMOIDOSCOPY;  Surgeon: Clarene Essex, MD;  Location: [facility];  Service: Endoscopy;  Laterality: N/A;  . HOT HEMOSTASIS N/A 11/20/2017   Procedure: HOT HEMOSTASIS  (ARGON PLASMA COAGULATION/BICAP);  Surgeon: Clarene Essex, MD;  Location: [facility];  Service: Endoscopy;  Laterality: N/A;  . LAPAROSCOPIC SUPRACERVICAL HYSTERECTOMY  2007   Leiomyoma  . ROTATOR CUFF REPAIR    . THUMB ARTHROSCOPY    . TUBAL LIGATION      Family History  Problem Relation Age of Onset  . COPD Mother   . Prostate cancer Father 32       also throat ca; deceased 39  . Hypertension Father   . Heart disease Brother   . Diabetes Brother   . Stroke Brother   . Breast cancer Maternal Aunt 80  . Colon cancer Maternal Aunt 80       currently 98  . Breast cancer Cousin        dx 15s; daughter of mat aunt with cancer  . Breast cancer Cousin        dx 59s; daughter of mat aunt with cancer    Social History   Socioeconomic History  . Marital status: Married    Spouse name: Not on file  . Number of children: Not on file  . Years of education: Not on file  . Highest education level: Not on file  Occupational History  . Not on file  Tobacco Use  . Smoking status: Never Smoker  . Smokeless tobacco: Never Used  Vaping Use  . Vaping Use: Never  used  Substance and Sexual Activity  . Alcohol use: Yes    Alcohol/week: 7.0 standard drinks    Types: 7 Standard drinks or equivalent per week  . Drug use: No  . Sexual activity: Yes    Birth control/protection: Surgical    Comment: 1st intercourse 60 yo-fewer than 5 partners-HYST  Other Topics Concern  . Not on file  Social History Narrative  . Not on file   Social Determinants of Health   Financial Resource Strain: Not on file  Food Insecurity: Not on file  Transportation Needs: Not on file  Physical Activity: Not on file  Stress: Not on file  Social Connections: Not on file  Intimate Partner Violence: Not on file    Outpatient Medications Prior to Visit  Medication Sig Dispense Refill  . beta carotene 10000 UNIT capsule Take 50,000 Units by mouth daily.    . Biotin 10000 MCG TBDP Take 10,000 mcg by mouth  daily.    . Black Cohosh 540 MG CAPS Take 540 mg by mouth daily.     . Calcium Carbonate-Vitamin D 600-400 MG-UNIT tablet Take 1 tablet by mouth 2 (two) times daily. 60 tablet 11  . docusate sodium (COLACE) 50 MG capsule Take 1 capsule (50 mg total) by mouth daily. 10 capsule 0  . Lactobacillus (ACIDOPHILUS PROBIOTIC PO) Take 1 capsule by mouth 2 (two) times daily.    Marland Kitchen loratadine (CLARITIN) 10 MG tablet Take 10 mg by mouth daily.    . NONFORMULARY OR COMPOUNDED ITEM Diltiazem 1% and lidocaine cream. Apply rectally nightly 1 each 0  . NONFORMULARY OR COMPOUNDED ITEM Knee high medium grade compression stockings. Apply to both legs daily. Remove at night 2 each 0  . phentermine (ADIPEX-P) 37.5 MG tablet One tab by mouth qAM 30 tablet 0  . RASPBERRY KETONES PO Take 500 mg by mouth daily.    Marland Kitchen topiramate (TOPAMAX) 50 MG tablet Take 1 tablet (50 mg total) by mouth 2 (two) times daily. Take 25mg  daily for 3 days then increase to 25mg  BID x3 days then 50mg  BID. 60 tablet 0  . traMADol (ULTRAM) 50 MG tablet Take 1 tablet (50 mg total) by mouth at bedtime as needed for moderate pain or severe pain. Maximum 6 tabs per day. 30 tablet 2  . Turmeric Curcumin 500 MG CAPS Take 500 mg by mouth daily.    Alveda Reasons 10 MG TABS tablet TAKE 1 TABLET BY MOUTH DAILY 90 tablet 1   No facility-administered medications prior to visit.    Allergies  Allergen Reactions  . Estrogens Other (See Comments)    VTE  . Eliquis [Apixaban] Other (See Comments)    Myalgia, weight gain  . Vicodin [Hydrocodone-Acetaminophen] Nausea And Vomiting    Review of Systems  Constitutional: Negative for appetite change, diaphoresis, fatigue and fever.  HENT: Negative for congestion, postnasal drip, sinus pain and sneezing.   Eyes: Negative for redness and itching.  Respiratory: Negative for cough, chest tightness and shortness of breath.   Cardiovascular: Negative for chest pain and palpitations.  Gastrointestinal: Negative for  abdominal distention, abdominal pain, constipation, diarrhea, nausea and vomiting.  Endocrine: Positive for cold intolerance. Negative for polydipsia.  Genitourinary: Negative for dysuria, frequency and urgency.  Musculoskeletal: Negative for arthralgias, back pain and myalgias.  Skin: Positive for wound.       Sporadic blisters that scab over and heal slowly.    Neurological: Negative for syncope, weakness, numbness and headaches.  Hematological: Bruises/bleeds easily.  Anticoagulated due to hist of DVT/PE  Psychiatric/Behavioral: Negative.        Objective:    Physical Exam Constitutional:      General: She is not in acute distress.    Appearance: Normal appearance. She is not ill-appearing.  HENT:     Head: Normocephalic.     Nose: Nose normal.  Eyes:     Extraocular Movements: Extraocular movements intact.     Conjunctiva/sclera: Conjunctivae normal.     Pupils: Pupils are equal, round, and reactive to light.  Pulmonary:     Effort: Pulmonary effort is normal.  Abdominal:     General: There is no distension.  Musculoskeletal:        General: No swelling or tenderness.  Skin:      Neurological:     General: No focal deficit present.     Mental Status: She is alert and oriented to person, place, and time. Mental status is at baseline.  Psychiatric:        Mood and Affect: Mood normal.        Behavior: Behavior normal.    2 open wounds were cleaned with Hibiclens and derma bond was placed over open wound. Wound was wrapped in coban.   BP 111/70   Pulse 82   Wt 123 lb (55.8 kg)   SpO2 100%   BMI 22.50 kg/m  Wt Readings from Last 3 Encounters:  08/07/20 123 lb (55.8 kg)  10/08/19 124 lb (56.2 kg)  09/03/18 117 lb (53.1 kg)    Health Maintenance Due  Topic Date Due  . Hepatitis C Screening  Never done  . HIV Screening  Never done  . MAMMOGRAM  12/22/2019  . INFLUENZA VACCINE  Never done  . COVID-19 Vaccine (3 - Booster for Pfizer series) 05/05/2020        Assessment & Plan:  Marland KitchenMarland KitchenDiagnoses and all orders for this visit:  Open wound -     cephALEXin (KEFLEX) 500 MG capsule; Take 1 capsule (500 mg total) by mouth 2 (two) times daily.  Cracked skin -     clobetasol cream (TEMOVATE) 0.05 %; Apply 1 application topically 2 (two) times daily.   Her hands appear extremely dry and eczematous. The 2 large cracks were cleaned and sealed with derma bond to see if the skin will hold together. I gave topical steroid to combine with eucerin/cetaphil for twice a day. Consider wrapping hands in saran wrap at night with vasoline or moisturizer. Avoid hand sanitizer or any alcohol based products. I gave rx for keflex. Told to hold for now. I did not see any signs of infection. 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 If fingers start to swell, get redder or more painful start keflex.      Marland KitchenHarlon Flor PA-C, have reviewed and agree with the above documentation in it's entirety.

## 2020-08-07 NOTE — Patient Instructions (Signed)
Keflex sent  Topical steroid to use with eurcerin.

## 2020-08-10 ENCOUNTER — Encounter: Payer: Self-pay | Admitting: Physician Assistant

## 2020-08-10 DIAGNOSIS — R234 Changes in skin texture: Secondary | ICD-10-CM | POA: Insufficient documentation

## 2020-08-10 DIAGNOSIS — T148XXA Other injury of unspecified body region, initial encounter: Secondary | ICD-10-CM | POA: Insufficient documentation

## 2020-09-21 ENCOUNTER — Ambulatory Visit (INDEPENDENT_AMBULATORY_CARE_PROVIDER_SITE_OTHER): Payer: 59 | Admitting: Medical-Surgical

## 2020-09-21 ENCOUNTER — Encounter: Payer: Self-pay | Admitting: Medical-Surgical

## 2020-09-21 ENCOUNTER — Other Ambulatory Visit: Payer: Self-pay

## 2020-09-21 VITALS — BP 126/77 | HR 80 | Temp 98.5°F | Ht 62.0 in | Wt 129.0 lb

## 2020-09-21 DIAGNOSIS — M546 Pain in thoracic spine: Secondary | ICD-10-CM

## 2020-09-21 DIAGNOSIS — R635 Abnormal weight gain: Secondary | ICD-10-CM | POA: Diagnosis not present

## 2020-09-21 LAB — POCT URINALYSIS DIP (CLINITEK)
Bilirubin, UA: NEGATIVE
Blood, UA: NEGATIVE
Glucose, UA: NEGATIVE mg/dL
Ketones, POC UA: NEGATIVE mg/dL
Leukocytes, UA: NEGATIVE
Nitrite, UA: NEGATIVE
POC PROTEIN,UA: NEGATIVE
Spec Grav, UA: 1.01 (ref 1.010–1.025)
Urobilinogen, UA: 0.2 E.U./dL
pH, UA: 6 (ref 5.0–8.0)

## 2020-09-21 MED ORDER — PREDNISONE 50 MG PO TABS: 50.0000 mg | ORAL_TABLET | Freq: Every day | ORAL | 0 refills | Status: DC

## 2020-09-21 MED ORDER — CYCLOBENZAPRINE HCL 10 MG PO TABS: 10.0000 mg | ORAL_TABLET | Freq: Three times a day (TID) | ORAL | 0 refills | Status: DC | PRN

## 2020-09-21 NOTE — Progress Notes (Signed)
Subjective:    CC: back pain, weight gain  HPI: Julie Harding 60 year old female presenting today for the following:  Back pain-notes that she had an injury earlier this year to her right thoracic back.  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  She also runs over 7 miles a day and rides horses.  Notes that her pain started at the end of her last rowing challenge.  She did allow 1 week of rest but when she resumed rowing she was very sore.  After that she did limit her distance with throwing.  She was feeling better and decided to resume her regular rowing yesterday.  Unfortunately, by last night she was in excruciating pain.  Notes that it hurts to take a deep breath as well as reposition in bed.  Since her initial injury, she has a friend who is a physical therapist.  That friend provided her with home exercises to do and she has done them reliably.  She has tried multiple things at home including tizanidine 3 times daily, methocarbamol 3 times daily, ibuprofen as needed sparingly, heat, ice, and CBD cream.  None of these medications have helped tremendously although the methocarbamol with ibuprofen did help a little.  Weight gain-she is very concerned about her weight and recent weight gain.  She described her daily diet which seems very restrictive (2 spoons of yogurt for breakfast, 1/2 peanut butter and jelly sandwich with a few pretzel sticks for lunch, and 1/2 chicken breast with steamed broccoli for dinner).  She does have 1 to 2 glasses of red wine at night.  Extremely active as described above.  Previously took phentermine with Topamax and would like to restart that today.  I reviewed the past medical history, family history, social history, surgical history, and allergies today and no changes were needed.  Please see the problem list section below in epic for further details.  Past Medical History: Past Medical History:  Diagnosis Date  . Acute deep vein thrombosis (DVT) of femoral vein of left  lower extremity ([place]) 08/04/2017   DVT 2014 -  Idiopathic etiology this was several years ago.   Patient took 3 months of anticoagulation medications.  Marland Kitchen DVT (deep venous thrombosis) ([place]) 2015   Right leg  . Endometrioma 2008   found by colonoscopy   . Family history of cancer   . Genetic testing 12/01/2017   Common Cancers panel (47 genes) @ [facility] - No pathogenic mutations detected  . Pulmonary embolus ([place]) 08/2016  . Uterine fibroid    Past Surgical History: Past Surgical History:  Procedure Laterality Date  . COLONOSCOPY    . FLEXIBLE SIGMOIDOSCOPY N/A 11/20/2017   Procedure: FLEXIBLE SIGMOIDOSCOPY;  Surgeon: Clarene Essex, MD;  Location: [facility];  Service: Endoscopy;  Laterality: N/A;  . HOT HEMOSTASIS N/A 11/20/2017   Procedure: HOT HEMOSTASIS (ARGON PLASMA COAGULATION/BICAP);  Surgeon: Clarene Essex, MD;  Location: [facility];  Service: Endoscopy;  Laterality: N/A;  . LAPAROSCOPIC SUPRACERVICAL HYSTERECTOMY  2007   Leiomyoma  . ROTATOR CUFF REPAIR    . THUMB ARTHROSCOPY    . TUBAL LIGATION     Social History: Social History   Socioeconomic History  . Marital status: Married    Spouse name: Not on file  . Number of children: Not on file  . Years of education: Not on file  . Highest education level: Not on file  Occupational History  . Not on file  Tobacco Use  . Smoking status: Never Smoker  .  Smokeless tobacco: Never Used  Vaping Use  . Vaping Use: Never used  Substance and Sexual Activity  . Alcohol use: Yes    Alcohol/week: 7.0 standard drinks    Types: 7 Standard drinks or equivalent per week  . Drug use: No  . Sexual activity: Yes    Birth control/protection: Surgical    Comment: 1st intercourse 60 yo-fewer than 5 partners-HYST  Other Topics Concern  . Not on file  Social History Narrative  . Not on file   Social Determinants of Health   Financial Resource Strain: Not on file  Food Insecurity: Not on file  Transportation Needs: Not on file   Physical Activity: Not on file  Stress: Not on file  Social Connections: Not on file   Family History: Family History  Problem Relation Age of Onset  . COPD Mother   . Prostate cancer Father 11       also throat ca; deceased 21  . Hypertension Father   . Heart disease Brother   . Diabetes Brother   . Stroke Brother   . Breast cancer Maternal Aunt 80  . Colon cancer Maternal Aunt 80       currently 68  . Breast cancer Cousin        dx 59s; daughter of mat aunt with cancer  . Breast cancer Cousin        dx 31s; daughter of mat aunt with cancer   Allergies: Allergies  Allergen Reactions  . Estrogens Other (See Comments)    VTE  . Eliquis [Apixaban] Other (See Comments)    Myalgia, weight gain  . Vicodin [Hydrocodone-Acetaminophen] Nausea And Vomiting   Medications: See med rec.  Review of Systems: See HPI for pertinent positives and negatives.   Objective:    General: Well Developed, well nourished, and in no acute distress.  Neuro: Alert and oriented x3.  HEENT: Normocephalic, atraumatic.  Skin: Warm and dry. Cardiac: Regular rate and rhythm, no murmurs rubs or gallops, no lower extremity edema.  Respiratory: Clear to auscultation bilaterally. Not using accessory muscles, speaking in full sentences.  Impression and Recommendations:    1. Acute right-sided thoracic back pain POCT UA negative.  Prednisone 50 mg daily x5 days.  Flexeril 10 mg 3 times daily as needed.  Okay to take methocarbamol during the day to avoid drowsiness but use Flexeril at night.  Continue conservative treatment with heat/ice, Tylenol, massage, etc.  Recommend avoiding ibuprofen since she is on Xarelto.  Continue physical therapy home exercises.  Recommend 2-3 weeks of rest from rowing.  If performing activities that require use of the upper extremities and strain on the affected area, try avoiding use of the right arm. - POCT URINALYSIS DIP (CLINITEK)  2. Abnormal weight gain Discussed  restrictive diet and the possibility that she is not eating enough.  Also discussed the relationship between muscle and fat in terms of weight.  She is most concerned that her clothes are not fitting.  Advised patient that I would discuss with her PCP regarding restarting the medications since she is technically in a normal weight.  After reviewing the concerns with Dr. Zigmund Daniel, he would like to have her follow-up in 2 to 3 weeks to discuss further.  Return in about 2 weeks (around 10/05/2020) for back pain and weight gain follow up. ___________________________________________ Clearnce Sorrel, DNP, APRN, FNP-BC [facility]

## 2020-10-14 ENCOUNTER — Other Ambulatory Visit: Payer: Self-pay | Admitting: Family Medicine

## 2020-10-14 DIAGNOSIS — Z7901 Long term (current) use of anticoagulants: Secondary | ICD-10-CM

## 2020-10-14 DIAGNOSIS — I2699 Other pulmonary embolism without acute cor pulmonale: Secondary | ICD-10-CM

## 2020-10-14 LAB — CBC
HCT: 43.4 % (ref 35.0–45.0)
Hemoglobin: 14.4 g/dL (ref 11.7–15.5)
MCH: 31.8 pg (ref 27.0–33.0)
MCHC: 33.2 g/dL (ref 32.0–36.0)
MCV: 95.8 fL (ref 80.0–100.0)
MPV: 9.3 fL (ref 7.5–12.5)
Platelets: 236 10*3/uL (ref 140–400)
RBC: 4.53 10*6/uL (ref 3.80–5.10)
RDW: 11.3 % (ref 11.0–15.0)
WBC: 5.5 10*3/uL (ref 3.8–10.8)

## 2020-10-14 LAB — BASIC METABOLIC PANEL
BUN: 18 mg/dL (ref 7–25)
CO2: 31 mmol/L (ref 20–32)
Calcium: 10.1 mg/dL (ref 8.6–10.4)
Chloride: 103 mmol/L (ref 98–110)
Creat: 0.66 mg/dL (ref 0.50–1.05)
Glucose, Bld: 68 mg/dL (ref 65–99)
Potassium: 4.5 mmol/L (ref 3.5–5.3)
Sodium: 139 mmol/L (ref 135–146)

## 2020-10-14 NOTE — Telephone Encounter (Signed)
Virtual scheduled for Friday. Patient was agreeable about labs and stated she would go this morning or afternoon to have labs done. AM

## 2020-10-16 ENCOUNTER — Telehealth (INDEPENDENT_AMBULATORY_CARE_PROVIDER_SITE_OTHER): Payer: 59 | Admitting: Family Medicine

## 2020-10-16 ENCOUNTER — Encounter: Payer: Self-pay | Admitting: Family Medicine

## 2020-10-16 DIAGNOSIS — I2699 Other pulmonary embolism without acute cor pulmonale: Secondary | ICD-10-CM | POA: Diagnosis not present

## 2020-10-16 DIAGNOSIS — Z7901 Long term (current) use of anticoagulants: Secondary | ICD-10-CM | POA: Diagnosis not present

## 2020-10-16 DIAGNOSIS — R635 Abnormal weight gain: Secondary | ICD-10-CM | POA: Diagnosis not present

## 2020-10-16 MED ORDER — RIVAROXABAN 10 MG PO TABS: 10.0000 mg | ORAL_TABLET | Freq: Every day | ORAL | 6 refills | Status: DC

## 2020-10-16 MED ORDER — PHENTERMINE HCL 15 MG PO CAPS: 15.0000 mg | ORAL_CAPSULE | ORAL | 2 refills | Status: DC

## 2020-10-16 MED ORDER — TOPIRAMATE 50 MG PO TABS: ORAL_TABLET | ORAL | 3 refills | Status: DC

## 2020-10-16 NOTE — Progress Notes (Signed)
Xarelto refill. Saw Dr. Lake Bells after bilateral PE's.  Lab results.

## 2020-10-18 NOTE — Assessment & Plan Note (Addendum)
Topiramate and low dose phentermine sent in to help with maintaining healthy weight.    Continue healthy lifestyle.

## 2020-10-18 NOTE — Assessment & Plan Note (Signed)
Chronic Xarelto use due to recurrent DVT.  She is doing well with this, rx renewed.   

## 2020-10-18 NOTE — Progress Notes (Signed)
Julie Harding - 60 y.o. female MRN [id number]  Date of birth: 1961/06/02   This visit type was conducted due to national recommendations for restrictions regarding the COVID-19 Pandemic (e.g. social distancing).  This format is felt to be most appropriate for this patient at this time.  All issues noted in this document were discussed and addressed.  No physical exam was performed (except for noted visual exam findings with Video Visits).  I discussed the limitations of evaluation and management by telemedicine and the availability of in person appointments. The patient expressed understanding and agreed to proceed.  I connected with@ on 10/18/20 at  9:10 AM EDT by a video enabled telemedicine application and verified that I am speaking with the correct person using two identifiers.  Present at visit: Julie Nutting, DO Julie Harding   Patient Location: [place] [address]   Provider location:   [facility]  Chief Complaint  Patient presents with  . Medication Refill    HPI  Julie Harding is a 60 y.o. female who presents via audio/video conferencing for a telehealth visit today.  She is following up today for anticoagulation.  She is on chronic Xarelto for history of recurrent DVT.  She is doing well with this.  Recent renal function and CBC normal.   She is also concerned about weight gain.  She remains very active and gets about 35,000 steps in per day and well as rowing.  Weight is up about 13 lbs over the past 6 months.  She has been unable to maintain her weight despite her activity level.  Weight gain leads to more joint pain for her. She has had good success with topiramate and phentermine previously to help with maintaining her weight.     ROS:  A comprehensive ROS was completed and negative except as noted per HPI  Past Medical History:  Diagnosis Date  . Acute deep vein thrombosis (DVT) of femoral vein of left lower extremity ([place]) 08/04/2017   DVT  2014 -  Idiopathic etiology this was several years ago.   Patient took 3 months of anticoagulation medications.  Marland Kitchen DVT (deep venous thrombosis) ([place]) 2015   Right leg  . Endometrioma 2008   found by colonoscopy   . Family history of cancer   . Genetic testing 12/01/2017   Common Cancers panel (47 genes) @ [facility] - No pathogenic mutations detected  . Pulmonary embolus ([place]) 08/2016  . Uterine fibroid     Past Surgical History:  Procedure Laterality Date  . COLONOSCOPY    . FLEXIBLE SIGMOIDOSCOPY N/A 11/20/2017   Procedure: FLEXIBLE SIGMOIDOSCOPY;  Surgeon: Clarene Essex, MD;  Location: [facility];  Service: Endoscopy;  Laterality: N/A;  . HOT HEMOSTASIS N/A 11/20/2017   Procedure: HOT HEMOSTASIS (ARGON PLASMA COAGULATION/BICAP);  Surgeon: Clarene Essex, MD;  Location: [facility];  Service: Endoscopy;  Laterality: N/A;  . LAPAROSCOPIC SUPRACERVICAL HYSTERECTOMY  2007   Leiomyoma  . ROTATOR CUFF REPAIR    . THUMB ARTHROSCOPY    . TUBAL LIGATION      Family History  Problem Relation Age of Onset  . COPD Mother   . Prostate cancer Father 70       also throat ca; deceased 51  . Hypertension Father   . Heart disease Brother   . Diabetes Brother   . Stroke Brother   . Breast cancer Maternal Aunt 80  . Colon cancer Maternal Aunt 80  currently 85  . Breast cancer Cousin        dx 23s; daughter of mat aunt with cancer  . Breast cancer Cousin        dx 73s; daughter of mat aunt with cancer    Social History   Socioeconomic History  . Marital status: Married    Spouse name: Not on file  . Number of children: Not on file  . Years of education: Not on file  . Highest education level: Not on file  Occupational History  . Not on file  Tobacco Use  . Smoking status: Never Smoker  . Smokeless tobacco: Never Used  Vaping Use  . Vaping Use: Never used  Substance and Sexual Activity  . Alcohol use: Yes    Alcohol/week: 7.0 standard drinks    Types: 7 Standard drinks or  equivalent per week  . Drug use: No  . Sexual activity: Yes    Birth control/protection: Surgical    Comment: 1st intercourse 60 yo-fewer than 5 partners-HYST  Other Topics Concern  . Not on file  Social History Narrative  . Not on file   Social Determinants of Health   Financial Resource Strain: Not on file  Food Insecurity: Not on file  Transportation Needs: Not on file  Physical Activity: Not on file  Stress: Not on file  Social Connections: Not on file  Intimate Partner Violence: Not on file     Current Outpatient Medications:  .  beta carotene 10000 UNIT capsule, Take 50,000 Units by mouth daily., Disp: , Rfl:  .  Biotin 10000 MCG TBDP, Take 10,000 mcg by mouth daily., Disp: , Rfl:  .  Black Cohosh 540 MG CAPS, Take 540 mg by mouth daily. , Disp: , Rfl:  .  Calcium Carbonate-Vitamin D 600-400 MG-UNIT tablet, Take 1 tablet by mouth 2 (two) times daily., Disp: 60 tablet, Rfl: 11 .  clobetasol cream (TEMOVATE) 8.18 %, Apply 1 application topically 2 (two) times daily., Disp: 60 g, Rfl: 2 .  Lactobacillus (ACIDOPHILUS PROBIOTIC PO), Take 1 capsule by mouth 2 (two) times daily., Disp: , Rfl:  .  loratadine (CLARITIN) 10 MG tablet, Take 10 mg by mouth daily., Disp: , Rfl:  .  NONFORMULARY OR COMPOUNDED ITEM, Diltiazem 1% and lidocaine cream. Apply rectally nightly, Disp: 1 each, Rfl: 0 .  NONFORMULARY OR COMPOUNDED ITEM, Knee high medium grade compression stockings. Apply to both legs daily. Remove at night, Disp: 2 each, Rfl: 0 .  phentermine 15 MG capsule, Take 1 capsule (15 mg total) by mouth every morning., Disp: 30 capsule, Rfl: 2 .  RASPBERRY KETONES PO, Take 500 mg by mouth daily., Disp: , Rfl:  .  Turmeric Curcumin 500 MG CAPS, Take 500 mg by mouth daily., Disp: , Rfl:  .  rivaroxaban (XARELTO) 10 MG TABS tablet, Take 1 tablet (10 mg total) by mouth daily., Disp: 30 tablet, Rfl: 6 .  topiramate (TOPAMAX) 50 MG tablet, One half tab p.o. nightly for 1 week then 1 tab p.o.  nightly for 1 week then 1 tab p.o. twice daily, Disp: 60 tablet, Rfl: 3  EXAM:  VITALS per patient if applicable: Pulse (!) 563   Ht 5\' 2"  (1.575 m)   Wt 126 lb (57.2 kg)   BMI 23.05 kg/m   GENERAL: alert, oriented, appears well and in no acute distress  HEENT: atraumatic, conjunttiva clear, no obvious abnormalities on inspection of external nose and ears  NECK: normal movements of the head and  neck  LUNGS: on inspection no signs of respiratory distress, breathing rate appears normal, no obvious gross SOB, gasping or wheezing  CV: no obvious cyanosis  MS: moves all visible extremities without noticeable abnormality  PSYCH/NEURO: pleasant and cooperative, no obvious depression or anxiety, speech and thought processing grossly intact  ASSESSMENT AND PLAN:  Discussed the following assessment and plan:  Chronic anticoagulation Chronic Xarelto use due to recurrent DVT.  She is doing well with this, rx renewed.    Abnormal weight gain Topiramate and low dose phentermine sent in to help with maintaining healthy weight.    Continue healthy lifestyle.       I discussed the assessment and treatment plan with the patient. The patient was provided an opportunity to ask questions and all were answered. The patient agreed with the plan and demonstrated an understanding of the instructions.   The patient was advised to call back or seek an in-person evaluation if the symptoms worsen or if the condition fails to improve as anticipated.    Julie Nutting, DO

## 2021-01-15 ENCOUNTER — Encounter: Payer: Self-pay | Admitting: Family Medicine

## 2021-01-15 DIAGNOSIS — R635 Abnormal weight gain: Secondary | ICD-10-CM

## 2021-01-18 MED ORDER — TOPIRAMATE 50 MG PO TABS: ORAL_TABLET | ORAL | 3 refills | Status: DC

## 2021-01-18 MED ORDER — PHENTERMINE HCL 15 MG PO CAPS: 15.0000 mg | ORAL_CAPSULE | ORAL | 2 refills | Status: DC

## 2021-04-15 ENCOUNTER — Other Ambulatory Visit: Payer: Self-pay

## 2021-04-15 ENCOUNTER — Ambulatory Visit
Admission: EM | Admit: 2021-04-15 | Discharge: 2021-04-15 | Disposition: A | Discharge status: Home / Self Care | Payer: 59 | Attending: Urgent Care | Admitting: Urgent Care

## 2021-04-15 DIAGNOSIS — L03012 Cellulitis of left finger: Secondary | ICD-10-CM | POA: Diagnosis not present

## 2021-04-15 DIAGNOSIS — L120 Bullous pemphigoid: Secondary | ICD-10-CM | POA: Diagnosis not present

## 2021-04-15 MED ORDER — TRIAMCINOLONE ACETONIDE 0.1 % EX CREA: 1.0000 "application " | TOPICAL_CREAM | Freq: Two times a day (BID) | CUTANEOUS | 0 refills | Status: DC

## 2021-04-15 MED ORDER — DOXYCYCLINE HYCLATE 100 MG PO CAPS: 100.0000 mg | ORAL_CAPSULE | Freq: Two times a day (BID) | ORAL | 0 refills | Status: DC

## 2021-04-15 NOTE — ED Provider Notes (Signed)
Julie Harding   MRN: [id number] DOB: May 15, 1961  Subjective:   Julie Harding is a 60 y.o. female presenting for 2 to 3-week history of persistent intermittent boils over the right hand.  She has lanced some on her own and they have dried and recovered thereafter.  She is also had swelling and pain over the left index finger with pus.  She has not lanced this one on the left index finger.  States that over the past few years the boils have come and gone over both hands but is currently bothering her over the right hand.  States that her mother used to have problems with this too.  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  She has horses and dogs that she has to look after as well.  No current facility-administered medications for this encounter.  Current Outpatient Medications:    beta carotene 10000 UNIT capsule, Take 50,000 Units by mouth daily., Disp: , Rfl:    Biotin 10000 MCG TBDP, Take 10,000 mcg by mouth daily., Disp: , Rfl:    Black Cohosh 540 MG CAPS, Take 540 mg by mouth daily. , Disp: , Rfl:    Calcium Carbonate-Vitamin D 600-400 MG-UNIT tablet, Take 1 tablet by mouth 2 (two) times daily., Disp: 60 tablet, Rfl: 11   clobetasol cream (TEMOVATE) 7.74 %, Apply 1 application topically 2 (two) times daily., Disp: 60 g, Rfl: 2   Lactobacillus (ACIDOPHILUS PROBIOTIC PO), Take 1 capsule by mouth 2 (two) times daily., Disp: , Rfl:    loratadine (CLARITIN) 10 MG tablet, Take 10 mg by mouth daily., Disp: , Rfl:    NONFORMULARY OR COMPOUNDED ITEM, Diltiazem 1% and lidocaine cream. Apply rectally nightly, Disp: 1 each, Rfl: 0   NONFORMULARY OR COMPOUNDED ITEM, Knee high medium grade compression stockings. Apply to both legs daily. Remove at night, Disp: 2 each, Rfl: 0   phentermine 15 MG capsule, Take 1 capsule (15 mg total) by mouth every morning., Disp: 30 capsule, Rfl: 2    RASPBERRY KETONES PO, Take 500 mg by mouth daily., Disp: , Rfl:    rivaroxaban (XARELTO) 10 MG TABS tablet, Take 1 tablet (10 mg total) by mouth daily., Disp: 30 tablet, Rfl: 6   topiramate (TOPAMAX) 50 MG tablet, One half tab p.o. nightly for 1 week then 1 tab p.o. nightly for 1 week then 1 tab p.o. twice daily, Disp: 60 tablet, Rfl: 3   Turmeric Curcumin 500 MG CAPS, Take 500 mg by mouth daily., Disp: , Rfl:    Allergies  Allergen Reactions   Estrogens Other (See Comments)    VTE   Eliquis [Apixaban] Other (See Comments)    Myalgia, weight gain   Vicodin [Hydrocodone-Acetaminophen] Nausea And Vomiting    Past Medical History:  Diagnosis Date   Acute deep vein thrombosis (DVT) of femoral vein of left lower extremity ([place]) 08/04/2017   DVT 2014 -  Idiopathic etiology this was several years ago.   Patient took 3 months of anticoagulation medications.   DVT (deep venous thrombosis) ([place]) 2015   Right leg   Endometrioma 2008   found by colonoscopy    Family history of cancer    Genetic testing 12/01/2017   Common Cancers panel (47 genes) @ [facility] - No pathogenic mutations detected   Pulmonary embolus ([place]) 08/2016   Uterine fibroid      Past  Surgical History:  Procedure Laterality Date   COLONOSCOPY     FLEXIBLE SIGMOIDOSCOPY N/A 11/20/2017   Procedure: FLEXIBLE SIGMOIDOSCOPY;  Surgeon: Clarene Essex, MD;  Location: [facility];  Service: Endoscopy;  Laterality: N/A;   HOT HEMOSTASIS N/A 11/20/2017   Procedure: HOT HEMOSTASIS (ARGON PLASMA COAGULATION/BICAP);  Surgeon: Clarene Essex, MD;  Location: [facility];  Service: Endoscopy;  Laterality: N/A;   LAPAROSCOPIC SUPRACERVICAL HYSTERECTOMY  2007   Leiomyoma   ROTATOR CUFF REPAIR     THUMB ARTHROSCOPY     TUBAL LIGATION      Family History  Problem Relation Age of Onset   COPD Mother    Prostate cancer Father 16       also throat ca; deceased 46   Hypertension Father    Heart disease Brother    Diabetes Brother    Stroke  Brother    Breast cancer Maternal Aunt 80   Colon cancer Maternal Aunt 80       currently 27   Breast cancer Cousin        dx 95s; daughter of mat aunt with cancer   Breast cancer Cousin        dx 68s; daughter of mat aunt with cancer    Social History   Tobacco Use   Smoking status: Never   Smokeless tobacco: Never  Vaping Use   Vaping Use: Never used  Substance Use Topics   Alcohol use: Yes    Alcohol/week: 7.0 standard drinks    Types: 7 Standard drinks or equivalent per week   Drug use: No    ROS   Objective:   Vitals: BP 127/77 (BP Location: Left Arm)   Pulse 94   Temp 98.1 F (36.7 C) (Oral)   Resp 18   SpO2 98%   Physical Exam Constitutional:      General: She is not in acute distress.    Appearance: Normal appearance. She is well-developed. She is not ill-appearing, toxic-appearing or diaphoretic.  HENT:     Head: Normocephalic and atraumatic.     Right Ear: External ear normal.     Left Ear: External ear normal.     Nose: Nose normal.     Mouth/Throat:     Mouth: Mucous membranes are moist.     Pharynx: Oropharynx is clear.  Eyes:     General: No scleral icterus.    Extraocular Movements: Extraocular movements intact.     Pupils: Pupils are equal, round, and reactive to light.  Cardiovascular:     Rate and Rhythm: Normal rate.  Pulmonary:     Effort: Pulmonary effort is normal.  Musculoskeletal:       Hands:  Skin:    General: Skin is warm and dry.  Neurological:     General: No focal deficit present.     Mental Status: She is alert and oriented to person, place, and time.  Psychiatric:        Mood and Affect: Mood normal.        Behavior: Behavior normal.    Verbal consent obtained.  Left index finger swabbed with iodine.  Cold spray applied.  Lanced with an 18-gauge needle. ~1cc of thick purulence was drained.  Wound cleansed and dressed.  Assessment and Plan :   PDMP not reviewed this encounter.  1. Paronychia of finger of left  hand   2. Bullous pemphigoid     Counseled on the nature of bullous pemphigoid and will cover for both an inflammatory  and infectious process with triamcinolone cream, doxycycline.  Successful incision and drainage of the paronychia.  Use treatment with doxycycline as aforementioned.  Counseled on wound care, encouraged hand protection and hand hygiene when working on her farm. Counseled patient on potential for adverse effects with medications prescribed/recommended today, ER and return-to-clinic precautions discussed, patient verbalized understanding.    Jaynee Eagles, [place] 04/15/21 1446

## 2021-04-15 NOTE — Discharge Instructions (Addendum)
Please apply triamcinolone cream to your right fingers using light layers twice daily for up to 1 week. Wait another week before restarting the cream as needed. Only use this 1 week at a time. Start doxycycline to address the infectious side of the problem. Keep your left finger covered and do warm soapy soaks 3-5 times daily 5-10 minutes at a time. Make sure you completely dry your hand thereafter.

## 2021-04-15 NOTE — ED Triage Notes (Signed)
Pt c/o blisters to bilat hands 2-3 weeks ago. Left pointer finger is causing significant pain which prompted this visit. States she has shown lesions to family doctor, has history of them, mother has history of them, but no definitive dx at this time.   Left index 8/10 burning pain.

## 2021-04-16 ENCOUNTER — Encounter: Payer: Self-pay | Admitting: Family Medicine

## 2021-04-16 NOTE — Telephone Encounter (Signed)
Suggest scheduling follow up to discuss all of this.

## 2021-04-20 ENCOUNTER — Other Ambulatory Visit: Payer: Self-pay

## 2021-04-20 ENCOUNTER — Ambulatory Visit: Payer: 59 | Admitting: Family Medicine

## 2021-04-20 ENCOUNTER — Encounter: Payer: Self-pay | Admitting: Family Medicine

## 2021-04-20 DIAGNOSIS — R635 Abnormal weight gain: Secondary | ICD-10-CM | POA: Diagnosis not present

## 2021-04-20 DIAGNOSIS — R21 Rash and other nonspecific skin eruption: Secondary | ICD-10-CM

## 2021-04-20 MED ORDER — PHENTERMINE HCL 30 MG PO CAPS: 30.0000 mg | ORAL_CAPSULE | ORAL | 0 refills | Status: DC

## 2021-04-20 NOTE — Progress Notes (Signed)
Julie Harding - 60 y.o. female MRN [id number]  Date of birth: 1960-09-26  Subjective Chief Complaint  Patient presents with   Auto immune blisters    HPI Julie Harding is a 60 y.o. female here today for follow up of recent urgent care visit.  She was seen at urgent care on 04/15/2021 for swelling of index finger as well as blistering rash.  She was diagnosed with paronychia as well as what appeared to be bullous pemphigoid.  I&D was completed for paronychia and she was started on doxycycline.  Today she continues to have some mild pain of the finger but this is improving.  Blisters that were present previously have ruptured leaving some ulcerated areas on the back of the fingers.  This is only affected her hands so far.  She denies any recent changes to medications, new soaps, lotions, detergents or exposure to other chemicals.  She has not had fever, chills or other systemic symptoms.  She does continue on phentermine and topiramate for weight management.  She notes that her weight is up 3 to 4 pounds over the past couple of weeks.  She does feel that phentermine and topiramate has been effective for her would like to try going up on phentermine temporarily to try to get through this plateau..  She does exercise frequently and feels like her diet is pretty healthy.  ROS:  A comprehensive ROS was completed and negative except as noted per HPI  Allergies  Allergen Reactions   Estrogens Other (See Comments)    VTE   Eliquis [Apixaban] Other (See Comments)    Myalgia, weight gain   Vicodin [Hydrocodone-Acetaminophen] Nausea And Vomiting    Past Medical History:  Diagnosis Date   Acute deep vein thrombosis (DVT) of femoral vein of left lower extremity ([place]) 08/04/2017   DVT 2014 -  Idiopathic etiology this was several years ago.   Patient took 3 months of anticoagulation medications.   Bullous pemphigoid    DVT (deep venous thrombosis) ([place]) 2015   Right leg   Endometrioma 2008   found  by colonoscopy    Family history of cancer    Genetic testing 12/01/2017   Common Cancers panel (47 genes) @ [facility] - No pathogenic mutations detected   Pulmonary embolus ([place]) 08/2016   Uterine fibroid     Past Surgical History:  Procedure Laterality Date   COLONOSCOPY     FLEXIBLE SIGMOIDOSCOPY N/A 11/20/2017   Procedure: FLEXIBLE SIGMOIDOSCOPY;  Surgeon: Clarene Essex, MD;  Location: [facility];  Service: Endoscopy;  Laterality: N/A;   HOT HEMOSTASIS N/A 11/20/2017   Procedure: HOT HEMOSTASIS (ARGON PLASMA COAGULATION/BICAP);  Surgeon: Clarene Essex, MD;  Location: [facility];  Service: Endoscopy;  Laterality: N/A;   LAPAROSCOPIC SUPRACERVICAL HYSTERECTOMY  2007   Leiomyoma   ROTATOR CUFF REPAIR     THUMB ARTHROSCOPY     TUBAL LIGATION      Social History   Socioeconomic History   Marital status: Married    Spouse name: Not on file   Number of children: Not on file   Years of education: Not on file   Highest education level: Not on file  Occupational History   Not on file  Tobacco Use   Smoking status: Never   Smokeless tobacco: Never  Vaping Use   Vaping Use: Never used  Substance and Sexual Activity   Alcohol use: Not Currently    Alcohol/week: 7.0 standard drinks    Types: 7 Glasses of wine  per week   Drug use: No   Sexual activity: Yes    Birth control/protection: Surgical    Comment: 1st intercourse 60 yo-fewer than 5 partners-HYST  Other Topics Concern   Not on file  Social History Narrative   Not on file   Social Determinants of Health   Financial Resource Strain: Not on file  Food Insecurity: Not on file  Transportation Needs: Not on file  Physical Activity: Not on file  Stress: Not on file  Social Connections: Not on file    Family History  Problem Relation Age of Onset   COPD Mother    Prostate cancer Father 6       also throat ca; deceased 49   Hypertension Father    Heart disease Brother    Diabetes Brother    Stroke Brother     Breast cancer Maternal Aunt 80   Colon cancer Maternal Aunt 80       currently 47   Breast cancer Cousin        dx 20s; daughter of mat aunt with cancer   Breast cancer Cousin        dx 61s; daughter of mat aunt with cancer    Health Maintenance  Topic Date Due   HIV Screening  Never done   Zoster Vaccines- Shingrix (2 of 2) 10/29/2018   MAMMOGRAM  12/22/2019   COVID-19 Vaccine (4 - Booster for [employer] series) 09/06/2020   INFLUENZA VACCINE  Never done   PAP SMEAR-Modifier  10/17/2022   COLONOSCOPY (Pts 45-71yrs Insurance coverage will need to be confirmed)  10/03/2027   TETANUS/TDAP  01/23/2028   Hepatitis C Screening  Completed   HPV VACCINES  Aged Out     ----------------------------------------------------------------------------------------------------------------------------------------------------------------------------------------------------------------- Physical Exam BP 127/84 (BP Location: Left Arm, Patient Position: Sitting, Cuff Size: Small)   Pulse 72   Ht 5\' 2"  (1.575 m)   Wt 119 lb (54 kg)   SpO2 100%   BMI 21.77 kg/m   Physical Exam Constitutional:      Appearance: Normal appearance.  HENT:     Head: Normocephalic and atraumatic.  Eyes:     General: No scleral icterus. Cardiovascular:     Rate and Rhythm: Normal rate and regular rhythm.  Pulmonary:     Effort: Pulmonary effort is normal.     Breath sounds: Normal breath sounds.  Musculoskeletal:     Cervical back: Neck supple.  Skin:    Comments: Mild swelling over site of previous I&D on left index finger.  There appear to be healing ulcerations along the dorsum of the right hand.  No new blistering noted.  Neurological:     General: No focal deficit present.     Mental Status: She is alert.  Psychiatric:        Mood and Affect: Mood normal.        Behavior: Behavior normal.     ------------------------------------------------------------------------------------------------------------------------------------------------------------------------------------------------------------------- Assessment and Plan  Blistering rash Description of rash would be consistent with bullous pemphigoid, unknown cause of this at this time.  Recommend application of topical steroid around edges of healing wounds.  She can cover ulcerations with Vaseline.  Instructed to contact clinic if new lesions appear.  We discussed need for dermatology referral if these continue.  Abnormal weight gain Will increase phentermine to 30 mg over the next month.  She will continue topiramate at current strength.   Meds ordered this encounter  Medications   phentermine 30 MG capsule    Sig:  Take 1 capsule (30 mg total) by mouth every morning.    Dispense:  30 capsule    Refill:  0    No follow-ups on file.    This visit occurred during the SARS-CoV-2 public health emergency.  Safety protocols were in place, including screening questions prior to the visit, additional usage of staff PPE, and extensive cleaning of exam room while observing appropriate contact time as indicated for disinfecting solutions.

## 2021-04-24 ENCOUNTER — Ambulatory Visit
Admission: EM | Admit: 2021-04-24 | Discharge: 2021-04-24 | Disposition: A | Discharge status: Home / Self Care | Payer: 59

## 2021-04-24 ENCOUNTER — Encounter: Payer: Self-pay | Admitting: *Deleted

## 2021-04-24 ENCOUNTER — Other Ambulatory Visit: Payer: Self-pay

## 2021-04-24 ENCOUNTER — Emergency Department (HOSPITAL_COMMUNITY)
Admission: EM | Admit: 2021-04-24 | Discharge: 2021-04-24 | Disposition: A | Discharge status: Home / Self Care | Payer: 59 | Attending: Emergency Medicine | Admitting: Emergency Medicine

## 2021-04-24 ENCOUNTER — Encounter (HOSPITAL_COMMUNITY): Payer: Self-pay | Admitting: Emergency Medicine

## 2021-04-24 DIAGNOSIS — L120 Bullous pemphigoid: Secondary | ICD-10-CM | POA: Diagnosis not present

## 2021-04-24 DIAGNOSIS — Z7901 Long term (current) use of anticoagulants: Secondary | ICD-10-CM | POA: Diagnosis not present

## 2021-04-24 DIAGNOSIS — T50905A Adverse effect of unspecified drugs, medicaments and biological substances, initial encounter: Secondary | ICD-10-CM

## 2021-04-24 DIAGNOSIS — L519 Erythema multiforme, unspecified: Secondary | ICD-10-CM | POA: Diagnosis not present

## 2021-04-24 DIAGNOSIS — L03012 Cellulitis of left finger: Secondary | ICD-10-CM | POA: Diagnosis not present

## 2021-04-24 DIAGNOSIS — T364X5A Adverse effect of tetracyclines, initial encounter: Secondary | ICD-10-CM

## 2021-04-24 DIAGNOSIS — T3695XA Adverse effect of unspecified systemic antibiotic, initial encounter: Secondary | ICD-10-CM | POA: Diagnosis not present

## 2021-04-24 DIAGNOSIS — R21 Rash and other nonspecific skin eruption: Secondary | ICD-10-CM

## 2021-04-24 HISTORY — DX: Bullous pemphigoid: L12.0

## 2021-04-24 LAB — C-REACTIVE PROTEIN: CRP: <0.5 mg/dL (ref <1.0)

## 2021-04-24 LAB — COMPREHENSIVE METABOLIC PANEL
ALT: 20 U/L (ref 0–44)
AST: 25 U/L (ref 15–41)
Albumin: 4.1 g/dL (ref 3.5–5.0)
Alkaline Phosphatase: 77 U/L (ref 38–126)
Anion gap: 6 (ref 5–15)
BUN: 15 mg/dL (ref 6–20)
CO2: 23 mmol/L (ref 22–32)
Calcium: 9.7 mg/dL (ref 8.9–10.3)
Chloride: 109 mmol/L (ref 98–111)
Creatinine, Ser: 0.72 mg/dL (ref 0.44–1.00)
GFR, Estimated: >60 mL/min (ref >60)
Glucose, Bld: 94 mg/dL (ref 70–99)
Potassium: 4 mmol/L (ref 3.5–5.1)
Sodium: 138 mmol/L (ref 135–145)
Total Bilirubin: 1 mg/dL (ref 0.3–1.2)
Total Protein: 6.6 g/dL (ref 6.5–8.1)

## 2021-04-24 LAB — HEPATITIS PANEL, ACUTE
HCV Ab: NONREACTIVE
Hep A IgM: NONREACTIVE
Hep B C IgM: NONREACTIVE
Hepatitis B Surface Ag: NONREACTIVE

## 2021-04-24 LAB — CBC WITH DIFFERENTIAL/PLATELET
Abs Immature Granulocytes: 0.02 10*3/uL (ref 0.00–0.07)
Basophils Absolute: 0.1 10*3/uL (ref 0.0–0.1)
Basophils Relative: 1 %
Eosinophils Absolute: 0.2 10*3/uL (ref 0.0–0.5)
Eosinophils Relative: 2 %
HCT: 43.5 % (ref 36.0–46.0)
Hemoglobin: 14.2 g/dL (ref 12.0–15.0)
Immature Granulocytes: 0 %
Lymphocytes Relative: 35 %
Lymphs Abs: 3 10*3/uL (ref 0.7–4.0)
MCH: 31.3 pg (ref 26.0–34.0)
MCHC: 32.6 g/dL (ref 30.0–36.0)
MCV: 96 fL (ref 80.0–100.0)
Monocytes Absolute: 0.6 10*3/uL (ref 0.1–1.0)
Monocytes Relative: 7 %
Neutro Abs: 4.7 10*3/uL (ref 1.7–7.7)
Neutrophils Relative %: 55 %
Platelets: 305 10*3/uL (ref 150–400)
RBC: 4.53 MIL/uL (ref 3.87–5.11)
RDW: 11.9 % (ref 11.5–15.5)
WBC: 8.4 10*3/uL (ref 4.0–10.5)
nRBC: 0 % (ref 0.0–0.2)

## 2021-04-24 LAB — SEDIMENTATION RATE: Sed Rate: 3 mm/hr (ref 0–22)

## 2021-04-24 LAB — RAPID HIV SCREEN (HIV 1/2 AB+AG)
HIV 1/2 Antibodies: NONREACTIVE
HIV-1 P24 Antigen - HIV24: NONREACTIVE

## 2021-04-24 NOTE — ED Provider Notes (Signed)
Emergency Medicine Provider Triage Evaluation Note  Julie Harding , a 60 y.o. female  was evaluated in triage.  Pt complains of oral lesions over the last couple days.  She does have a history of bullous pemphigoid.  Was recently placed on doxycycline 1-1/2 weeks ago after a paronychia was drained in urgent care.  Began noticing lesions over her nose and face that have blistered and of been bleeding.  She denies any trouble speaking, trouble swallowing, trouble breathing trouble swallowing.   Review of Systems  Positive:  Negative: See above   Physical Exam  BP (!) 133/91 (BP Location: Left Arm)   Pulse 81   Temp 98.1 F (36.7 C) (Oral)   Resp 17   Ht 5\' 1"  (1.549 m)   Wt 53.1 kg   SpO2 100%   BMI 22.11 kg/m  Gen:   Awake, no distress   Resp:  Normal effort  MSK:   Moves extremities without difficulty  Other:  Blisters over the upper and bottom lips and bridge of the nose.  No pharyngeal erythema or swelling.  Chest is clear to auscultation bilaterally.  Medical Decision Making  Medically screening exam initiated at 3:10 PM.  Appropriate orders placed.  Julie Harding was informed that the remainder of the evaluation will be completed by another provider, this initial triage assessment does not replace that evaluation, and the importance of remaining in the ED until their evaluation is complete.  Given the history of bullous pemphigoid and new doxycycline use I am concerned for possible developing Stevens-Johnson syndrome at this time.   Julie Bright [place], PA-C 04/24/21 1515    Julie Biles, MD 04/25/21 1349

## 2021-04-24 NOTE — Discharge Instructions (Addendum)
I am concerned that you have Katherina Right Syndrome given the rash on your face, lips and the recent prescription we used with doxycycline to treat your bullous pemphigoid and paronychia.

## 2021-04-24 NOTE — ED Provider Notes (Addendum)
[facility] EMERGENCY DEPARTMENT Provider Note   CSN: [id number] Arrival date & time: 04/24/21  1328     History No chief complaint on file.   Julie Harding is a 60 y.o. female.  HPI    60 year old female comes in with chief complaint of skin changes to her nose.  She has history of DVT/PE and is on Xarelto.  She also takes phentermine and Topamax.  Recently she went to urgent care where she was diagnosed with felon AND placed on doxycycline (about 10 days ago).  6 days ago she started having redness and peeling around her nose.  She suspected that it was her doxycycline and started avoiding sun.  More recently however her skin started turning dark over her nose and she also has dark appearing rash to her lower lip.   Patient stopped taking her doxycycline on Thursday.  Of note, patient reports that when she was at the urgent care she was told that she might have a bullous pemphigoid because she had blisters over some of her fingers.  She states that she has had these blisters off and on in the past and just thought they were because of form related exposures.  She denies any pain in her eye, vision change, itching or irritation to the eye.  There is also no rash to her vaginal area anal region and inside her mouth.  Review of system is negative for any fevers, chills.  With her symptoms progressing, she had gone to the urgent care and was advised to come to the ER.  Past Medical History:  Diagnosis Date   Acute deep vein thrombosis (DVT) of femoral vein of left lower extremity ([place]) 08/04/2017   DVT 2014 -  Idiopathic etiology this was several years ago.   Patient took 3 months of anticoagulation medications.   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   Chronic constipation 08/11/2018   Temperature intolerance 04/13/2018   History of repair of right rotator cuff 04/13/2018   Abnormal weight gain 12/12/2017   Genetic testing 12/01/2017   Family history of cancer    h/o DVT (deep venous thrombosis) ([place]) 09/04/2017   Vitamin D insufficiency 09/04/2017   Blood in stool 09/04/2017   Clotting disorder ([place]) 08/05/2017   Degenerative joint disease involving multiple joints 08/05/2017   Postmenopausal disorder 08/04/2017   Fatigue 08/04/2017   Chronic back pain greater than 3 months duration 08/04/2017   History of hysterectomy for benign disease 08/04/2017   Spinal stenosis, lumbar- chronic back pain; Dr Sherlyn Lick 08/04/2017   Exercise involving running- 29mi/ day 08/04/2017   Family history of depression 08/04/2017   h/o Pulmonary embolism ([place]) 05/26/2017    Past Surgical History:  Procedure Laterality Date   COLONOSCOPY     FLEXIBLE SIGMOIDOSCOPY N/A 11/20/2017   Procedure: FLEXIBLE SIGMOIDOSCOPY;  Surgeon: Clarene Essex, MD;  Location: [facility];  Service: Endoscopy;  Laterality: N/A;   HOT HEMOSTASIS N/A 11/20/2017   Procedure: HOT HEMOSTASIS (ARGON PLASMA COAGULATION/BICAP);  Surgeon: Clarene Essex, MD;  Location: [facility];  Service: Endoscopy;  Laterality: N/A;   LAPAROSCOPIC SUPRACERVICAL HYSTERECTOMY  2007   Leiomyoma   ROTATOR CUFF REPAIR     THUMB ARTHROSCOPY     TUBAL LIGATION       OB History     Gravida  0   Para  0   Term  0   Preterm  0   AB  0   Living  0      SAB  0   IAB  0   Ectopic  0   Multiple  0   Live Births  0           Family History   Problem Relation Age of Onset   COPD Mother    Prostate cancer Father 67       also throat ca; deceased 62   Hypertension Father    Heart disease Brother    Diabetes Brother    Stroke Brother    Breast cancer Maternal Aunt 80   Colon cancer Maternal Aunt 80       currently 59   Breast cancer Cousin        dx 67s; daughter of mat aunt with cancer   Breast cancer Cousin        dx 58s; daughter of mat aunt with cancer    Social History   Tobacco Use   Smoking status: Never   Smokeless tobacco: Never  Vaping Use   Vaping Use: Never used  Substance Use Topics   Alcohol use: Not Currently    Alcohol/week: 7.0 standard drinks    Types: 7 Glasses of wine per week   Drug use: No    Home Medications Prior to Admission medications   Medication Sig Start Date End Date Taking? Authorizing Provider  beta carotene 10000 UNIT capsule Take 50,000 Units by mouth daily.    [provider]  Biotin 10000 MCG TBDP Take 10,000 mcg by mouth daily.    [provider]  Black Cohosh 540 MG CAPS Take 540 mg by mouth daily.     [provider]  Calcium Carbonate-Vitamin D 600-400 MG-UNIT tablet Take 1 tablet by mouth 2 (two) times daily. 01/22/18   Trixie Dredge, PA-C  doxycycline (VIBRAMYCIN) 100 MG capsule Take 1 capsule (100 mg total) by mouth 2 (two) times daily. 04/15/21   Jaynee Eagles, PA-C  Lactobacillus (ACIDOPHILUS PROBIOTIC PO) Take 1 capsule by mouth 2 (two) times daily.    [provider]  loratadine (CLARITIN) 10 MG tablet Take 10 mg by mouth daily.    [provider]  NONFORMULARY OR COMPOUNDED ITEM Diltiazem 1% and lidocaine cream. Apply rectally nightly 08/06/18   Trixie Dredge, PA-C  phentermine 30 MG capsule Take 1 capsule (30 mg total) by mouth every morning. 04/20/21   [name], DO  RASPBERRY KETONES PO Take 500 mg by mouth daily.    [provider]  rivaroxaban (XARELTO) 10 MG TABS tablet Take 1  tablet (10 mg total) by mouth daily. 10/16/20   [name], DO  topiramate (TOPAMAX) 50 MG tablet One half tab p.o. nightly for 1 week then 1 tab p.o. nightly for 1 week then 1 tab p.o. twice daily 01/18/21   [name], DO  triamcinolone cream (KENALOG) 0.1 % Apply 1 application topically 2 (  two) times daily. 04/15/21   Jaynee Eagles, PA-C  Turmeric Curcumin 500 MG CAPS Take 500 mg by mouth daily.    [provider]    Allergies    Estrogens, Eliquis [apixaban], and Vicodin [hydrocodone-acetaminophen]  Review of Systems   Review of Systems  Constitutional:  Positive for activity change.  Skin:  Positive for rash.  Allergic/Immunologic: Negative for immunocompromised state.  Hematological:  Bruises/bleeds easily.  All other systems reviewed and are negative.  Physical Exam Updated Vital Signs BP (!) 142/91   Pulse 69   Temp 98.4 F (36.9 C)   Resp 14   Ht 5\' 1"  (1.549 m)   Wt 53.1 kg   SpO2 100%   BMI 22.11 kg/m   Physical Exam Vitals and nursing note reviewed.  Constitutional:      Appearance: She is well-developed.  HENT:     Head: Atraumatic.  Cardiovascular:     Rate and Rhythm: Normal rate.  Pulmonary:     Effort: Pulmonary effort is normal.  Musculoskeletal:     Cervical back: Normal range of motion and neck supple.  Skin:    General: Skin is warm and dry.     Comments: Please see the pictures below  Patient has necrotic appearing lesion over her nose, covering about 33% of it.  Her entire lower lip is also hyperpigmented.  She has couple of necrotic lesions above her upper lip.  No severe rash, blisters, skin loss elsewhere over her body  Neurological:     Mental Status: She is alert and oriented to person, place, and time.    ED Results / Procedures / Treatments   Labs (all labs ordered are listed, but only abnormal results are displayed) Labs Reviewed  CBC WITH DIFFERENTIAL/PLATELET  COMPREHENSIVE METABOLIC PANEL  SEDIMENTATION RATE   RAPID HIV SCREEN (HIV 1/2 AB+AG)  HEPATITIS PANEL, ACUTE  MYCOPLASMA PNEUMONIAE ANTIBODY, IGM  C-REACTIVE PROTEIN    EKG None  Radiology No results found.  Procedures .Critical Care Performed by: Varney Biles, MD Authorized by: Varney Biles, MD   Critical care provider statement:    Critical care time (minutes):  32   Critical care time was exclusive of:  Separately billable procedures and treating other patients   Critical care was necessary to treat or prevent imminent or life-threatening deterioration of the following conditions: Erythema multiforme.   Critical care was time spent personally by me on the following activities:  Discussions with consultants, evaluation of patient's response to treatment, examination of patient, ordering and performing treatments and interventions, ordering and review of laboratory studies, ordering and review of radiographic studies, pulse oximetry, re-evaluation of patient's condition, obtaining history from patient or surrogate, review of old charts and development of treatment plan with patient or surrogate          Medications Ordered in ED Medications - No data to display  ED Course  I have reviewed the triage vital signs and the nursing notes.  Pertinent labs & imaging results that were available during my care of the patient were reviewed by me and considered in my medical decision making (see chart for details).    MDM Rules/Calculators/A&P                          60 year old female with history of DVT/PE on Xarelto comes in with chief complaint of worsening rash to her face.  She was recently presumptively diagnosed with bullous pemphigoid.  Has not  had a biopsy of her blister yet.  She was recently started on doxycycline and subsequently developed some rash that has evolved over time to her face.  Differential diagnosis includes drug eruption secondary to doxycycline, erythema multiforme, Stevens-Johnson's  syndrome.  I think patient needs higher level of care than what I can provide or what our hospital can provide.  She needs dermatology evaluation.  I was able to get in touch with [facility] dermatology and spoke with Dr. Wynetta Emery.  He graciously listened to patient's presentation and has provided an option of close follow-up with dermatology clinic.  In my opinion patient is quite stable at this time, and a close outpatient follow-up is actually appropriate.  The caveat being that she will need to return to the ER if her symptoms are getting worse.  Patient is comfortable with the plan of following up with dermatology service at [facility].  She will receive a call from them soon.  In the interim, we have advised her to prevent sun exposure, keep the area covered when outside and also apply Vaseline to her lesions over her face.  Final Clinical Impression(s) / ED Diagnoses Final diagnoses:  Erythema multiforme    Rx / DC Orders ED Discharge Orders     None        Varney Biles, MD 04/24/21 [place], [place], MD 04/24/21 1755

## 2021-04-24 NOTE — ED Triage Notes (Signed)
Was seen 9/22 in urgent care and 9/27 at PCP office; states was placed on doxycycline.  Started with peeling skin on nose, followed by painful lesions to lower lip and nose onset 2-3 days ago; pt held abx dose today.  Denies any tongue lesions.

## 2021-04-24 NOTE — ED Provider Notes (Signed)
[name]   MRN: [id number] DOB: [birth year]  Subjective:   Julie Harding is a 60 y.o. female presenting for 2-3 day history of rash over the nose, lips. Rash is painful, stings. Was started on doxycycline on 04/15/2021 for bullous pemphigoid, paronychia which we did an I&D for. She saw her PCP as well on 09/27 and was in agreement about the diagnoses and treatment plan. Unfortunately, she developed this rash. Denies fever, chest pain, shob, n/v, abdominal pain.   No current facility-administered medications for this encounter.  Current Outpatient Medications:    beta carotene 10000 UNIT capsule, Take 50,000 Units by mouth daily., Disp: , Rfl:    Biotin 10000 MCG TBDP, Take 10,000 mcg by mouth daily., Disp: , Rfl:    Black Cohosh 540 MG CAPS, Take 540 mg by mouth daily. , Disp: , Rfl:    Calcium Carbonate-Vitamin D 600-400 MG-UNIT tablet, Take 1 tablet by mouth 2 (two) times daily., Disp: 60 tablet, Rfl: 11   doxycycline (VIBRAMYCIN) 100 MG capsule, Take 1 capsule (100 mg total) by mouth 2 (two) times daily., Disp: 20 capsule, Rfl: 0   Lactobacillus (ACIDOPHILUS PROBIOTIC PO), Take 1 capsule by mouth 2 (two) times daily., Disp: , Rfl:    loratadine (CLARITIN) 10 MG tablet, Take 10 mg by mouth daily., Disp: , Rfl:    phentermine 30 MG capsule, Take 1 capsule (30 mg total) by mouth every morning., Disp: 30 capsule, Rfl: 0   RASPBERRY KETONES PO, Take 500 mg by mouth daily., Disp: , Rfl:    rivaroxaban (XARELTO) 10 MG TABS tablet, Take 1 tablet (10 mg total) by mouth daily., Disp: 30 tablet, Rfl: 6   topiramate (TOPAMAX) 50 MG tablet, One half tab p.o. nightly for 1 week then 1 tab p.o. nightly for 1 week then 1 tab p.o. twice daily, Disp: 60 tablet, Rfl: 3   Turmeric Curcumin 500 MG CAPS, Take 500 mg by mouth daily., Disp: , Rfl:    NONFORMULARY OR COMPOUNDED ITEM, Diltiazem 1% and lidocaine cream. Apply rectally nightly, Disp: 1 each, Rfl: 0   triamcinolone cream (KENALOG)  0.1 %, Apply 1 application topically 2 (two) times daily., Disp: 30 g, Rfl: 0   Allergies  Allergen Reactions   Estrogens Other (See Comments)    VTE   Eliquis [Apixaban] Other (See Comments)    Myalgia, weight gain   Vicodin [Hydrocodone-Acetaminophen] Nausea And Vomiting    Past Medical History:  Diagnosis Date   Acute deep vein thrombosis (DVT) of femoral vein of left lower extremity ([place]) 08/04/2017   DVT 2014 -  Idiopathic etiology this was several years ago.   Patient took 3 months of anticoagulation medications.   DVT (deep venous thrombosis) ([place]) 2015   Right leg   Endometrioma 2008   found by colonoscopy    Family history of cancer    Genetic testing 12/01/2017   Common Cancers panel (47 genes) @ [facility] - No pathogenic mutations detected   Pulmonary embolus ([place]) 08/2016   Uterine fibroid      Past Surgical History:  Procedure Laterality Date   COLONOSCOPY     FLEXIBLE SIGMOIDOSCOPY N/A 11/20/2017   Procedure: FLEXIBLE SIGMOIDOSCOPY;  Surgeon: Clarene Essex, MD;  Location: [facility];  Service: Endoscopy;  Laterality: N/A;   HOT HEMOSTASIS N/A 11/20/2017   Procedure: HOT HEMOSTASIS (ARGON PLASMA COAGULATION/BICAP);  Surgeon: Clarene Essex, MD;  Location: [facility];  Service: Endoscopy;  Laterality: N/A;   LAPAROSCOPIC SUPRACERVICAL HYSTERECTOMY  2007  Leiomyoma   ROTATOR CUFF REPAIR     THUMB ARTHROSCOPY     TUBAL LIGATION      Family History  Problem Relation Age of Onset   COPD Mother    Prostate cancer Father 54       also throat ca; deceased 61   Hypertension Father    Heart disease Brother    Diabetes Brother    Stroke Brother    Breast cancer Maternal Aunt 80   Colon cancer Maternal Aunt 80       currently 65   Breast cancer Cousin        dx 60s; daughter of mat aunt with cancer   Breast cancer Cousin        dx 92s; daughter of mat aunt with cancer    Social History   Tobacco Use   Smoking status: Never   Smokeless tobacco: Never  Vaping  Use   Vaping Use: Never used  Substance Use Topics   Alcohol use: Not Currently    Alcohol/week: 7.0 standard drinks    Types: 7 Glasses of wine per week   Drug use: No    ROS   Objective:   Vitals: BP 121/70   Pulse 71   Temp 98.3 F (36.8 C) (Temporal)   Resp 16   SpO2 98%   Physical Exam Constitutional:      General: She is not in acute distress.    Appearance: Normal appearance. She is well-developed. She is not ill-appearing, toxic-appearing or diaphoretic.  HENT:     Head: Normocephalic and atraumatic.     Right Ear: External ear normal.     Left Ear: External ear normal.     Nose: Nose normal.     Mouth/Throat:     Mouth: Mucous membranes are moist.     Pharynx: No oropharyngeal exudate or posterior oropharyngeal erythema.  Eyes:     General: No scleral icterus.       Right eye: No discharge.        Left eye: No discharge.     Extraocular Movements: Extraocular movements intact.     Conjunctiva/sclera: Conjunctivae normal.     Pupils: Pupils are equal, round, and reactive to light.  Cardiovascular:     Rate and Rhythm: Normal rate and regular rhythm.     Pulses: Normal pulses.     Heart sounds: Normal heart sounds. No murmur heard.   No friction rub. No gallop.  Pulmonary:     Effort: Pulmonary effort is normal. No respiratory distress.     Breath sounds: Normal breath sounds. No stridor. No wheezing, rhonchi or rales.  Abdominal:     General: Bowel sounds are normal. There is no distension.     Palpations: Abdomen is soft. There is no mass.     Tenderness: There is no abdominal tenderness. There is no right CVA tenderness, left CVA tenderness, guarding or rebound.  Musculoskeletal:     Cervical back: Normal range of motion and neck supple.  Skin:    General: Skin is warm and dry.     Findings: Rash present.  Neurological:     General: No focal deficit present.     Mental Status: She is alert and oriented to person, place, and time.  Psychiatric:         Mood and Affect: Mood normal.        Behavior: Behavior normal.        Thought Content: Thought content normal.  Judgment: Judgment normal.        Assessment and Plan :   PDMP not reviewed this encounter.  1. Medication reaction, initial encounter   2. Bullous pemphigoid   3. Paronychia of finger of left hand     High suspicion for medication reaction, needs rule out Stevens-Johnson syndrome.  Discussed this with patient and emphasized need for further evaluation and intervention in the hospital.  Patient contracts for safety, her husband is coming to the clinic to pick her up and take her to the hospital now.   Jaynee Eagles, PA-C 04/24/21 1244

## 2021-04-24 NOTE — ED Notes (Signed)
RN reviewed discharge instructions w/ pt. Follow up reviewed, pt had no further questions 

## 2021-04-24 NOTE — ED Triage Notes (Addendum)
Pt states she was sent from [facility] for possible [place] Johnson's Syndrome.  Seen at [facility] on 9/22 and PCP office on 9/27.  States she was started on doxycycline after I&D for paronychia and bullous pemphigoid.  Reports rash over the nose and lips x 2-3 days.  States it started with peeling skin on nose followed by painful lesions to lower lip.

## 2021-04-24 NOTE — ED Notes (Signed)
Report called to Roselyn Reef, ED Charge RN.

## 2021-04-24 NOTE — Discharge Instructions (Signed)
We suspect that you have erythema multiforme, appropriate diagnosis is not clear.  I believe that getting you a close follow-up with dermatology is that right neck step.  Expect a call from [facility] dermatology to set up an appointment with you for sometime early next week.  Return to the ER if you start having increased area of rash, more skin is peeling off of your nose.  As discussed, please avoid sun exposure, if you are outside keep the area of concern covered.  Also apply Vaseline to your lip and your nose.

## 2021-04-24 NOTE — ED Notes (Signed)
Patient is being discharged from the Urgent [place] and sent to the Emergency Department via wheelchair by staff. Per M. [place], [place], patient is stable but in need of higher level of care due to concern for [employer] Syndrome. Patient is aware and verbalizes understanding of plan of care.  Vitals:   04/24/21 1203  BP: 121/70  Pulse: 71  Resp: 16  Temp: 98.3 F (36.8 C)  SpO2: 98%

## 2021-04-25 DIAGNOSIS — R21 Rash and other nonspecific skin eruption: Secondary | ICD-10-CM | POA: Insufficient documentation

## 2021-04-25 NOTE — Assessment & Plan Note (Signed)
Will increase phentermine to 30 mg over the next month.  She will continue topiramate at current strength.

## 2021-04-25 NOTE — Assessment & Plan Note (Signed)
Description of rash would be consistent with bullous pemphigoid, unknown cause of this at this time.  Recommend application of topical steroid around edges of healing wounds.  She can cover ulcerations with Vaseline.  Instructed to contact clinic if new lesions appear.  We discussed need for dermatology referral if these continue.

## 2021-04-26 ENCOUNTER — Telehealth: Payer: Self-pay | Admitting: General Practice

## 2021-04-26 NOTE — Telephone Encounter (Signed)
Transition Care Management Follow-up Telephone Call Date of discharge and from where: 04/24/21 from [facility] How have you been since you were released from the hospital? Still in pain. She still has blisters on her fingers and nose. She was asked to apply Vaseline and she is doing that and her nose seems to be getting better. She is waiting to hear back from [facility]. Any questions or concerns? No  Items Reviewed: Did the pt receive and understand the discharge instructions provided? Yes  Medications obtained and verified? Yes  Other? No  Any new allergies since your discharge? No  Dietary orders reviewed? Yes Do you have support at home? Yes   Home Care and Equipment/Supplies: Were home health services ordered? no  Functional Questionnaire: (I = Independent and D = Dependent) ADLs: I  Bathing/Dressing- I  Meal Prep- I  Eating- I  Maintaining continence- I  Transferring/Ambulation- I  Managing Meds- I  Follow up appointments reviewed:  PCP Hospital f/u appt confirmed? Patient stated that she was asked to follow up with dermatology at [facility] f/u appt confirmed?  She said that she is waiting for the [facility] to call her.    Are transportation arrangements needed? No  If their condition worsens, is the pt aware to call PCP or go to the Emergency Dept.? Yes Was the patient provided with contact information for the PCP's office or ED? Yes Was to pt encouraged to call back with questions or concerns? Yes

## 2021-04-27 LAB — MYCOPLASMA PNEUMONIAE ANTIBODY, IGM: Mycoplasma pneumo IgM: <770 U/mL (ref 0–769)

## 2021-05-03 ENCOUNTER — Encounter: Payer: Self-pay | Admitting: Family Medicine

## 2021-05-07 ENCOUNTER — Encounter: Payer: Self-pay | Admitting: Family Medicine

## 2021-05-26 ENCOUNTER — Other Ambulatory Visit: Payer: Self-pay | Admitting: Family Medicine

## 2021-05-26 DIAGNOSIS — R635 Abnormal weight gain: Secondary | ICD-10-CM

## 2021-06-28 ENCOUNTER — Other Ambulatory Visit: Payer: Self-pay | Admitting: Family Medicine

## 2021-06-28 DIAGNOSIS — Z7901 Long term (current) use of anticoagulants: Secondary | ICD-10-CM

## 2021-06-28 DIAGNOSIS — I2699 Other pulmonary embolism without acute cor pulmonale: Secondary | ICD-10-CM

## 2021-07-27 ENCOUNTER — Encounter: Payer: Self-pay | Admitting: Family Medicine

## 2021-07-27 DIAGNOSIS — R635 Abnormal weight gain: Secondary | ICD-10-CM

## 2021-07-29 MED ORDER — TOPIRAMATE 50 MG PO TABS: ORAL_TABLET | ORAL | 3 refills | Status: DC

## 2021-07-29 MED ORDER — PHENTERMINE HCL 15 MG PO CAPS: ORAL_CAPSULE | ORAL | 1 refills | Status: DC

## 2021-07-29 NOTE — Telephone Encounter (Signed)
Updated rx for phentermine and topiramate sent.

## 2021-09-23 ENCOUNTER — Other Ambulatory Visit: Payer: Self-pay | Admitting: Family Medicine

## 2021-09-24 ENCOUNTER — Telehealth: Payer: Self-pay

## 2021-09-24 NOTE — Telephone Encounter (Signed)
Noted, thanks!

## 2021-09-24 NOTE — Telephone Encounter (Signed)
Patient aware prescription was approved and sent to pharmacy.  ? ?

## 2021-09-24 NOTE — Telephone Encounter (Signed)
Patient reported that she was diagnosed with COVID on Saturday. She had bad cold symptoms which she treated with tylenol and sudafed. She stated that she is all good now.  ?

## 2021-11-01 ENCOUNTER — Other Ambulatory Visit: Payer: Self-pay | Admitting: Family Medicine

## 2021-11-01 NOTE — Telephone Encounter (Signed)
LVM got patient to call back to get appt scheduled. AM ?

## 2021-11-01 NOTE — Telephone Encounter (Signed)
Pls contact pt for appt concerning weight check and continued medication. Thanks ?

## 2021-12-07 ENCOUNTER — Ambulatory Visit: Payer: 59 | Admitting: Family Medicine

## 2021-12-07 ENCOUNTER — Encounter: Payer: Self-pay | Admitting: Family Medicine

## 2021-12-07 DIAGNOSIS — R635 Abnormal weight gain: Secondary | ICD-10-CM

## 2021-12-07 MED ORDER — PHENTERMINE HCL 37.5 MG PO CAPS: ORAL_CAPSULE | ORAL | 0 refills | Status: DC

## 2021-12-07 MED ORDER — TOPIRAMATE 50 MG PO TABS: ORAL_TABLET | ORAL | 3 refills | Status: DC

## 2021-12-07 NOTE — Assessment & Plan Note (Signed)
Continues to have difficulty keeping weight off.  We will phentermine back to 37.5 mg over the next few months.  Continue topiramate at current strength.  Continue regular exercise. ?

## 2021-12-07 NOTE — Progress Notes (Signed)
?Julie Harding - 61 y.o. female MRN [id number]  Date of birth: 1961/01/23 ? ?Subjective ?Chief Complaint  ?Patient presents with  ? Weight Loss  ? ? ?HPI ?Julie Harding is a 61 year old female here today for follow-up visit.  Reports overall she is feeling well.  She continues to have difficulty with weight loss.  We reduced her phentermine to 15 mg in addition to continuing topiramate.  She does continue to exercise regularly including daily rowing or jogging.  She is doing some resistance training as well.  She feels her diet is pretty clean and healthy.  She would like to try increasing her topiramate to 37.5 mg for a few months.   ? ?ROS:  A comprehensive ROS was completed and negative except as noted per HPI ? ? ? ? ?Allergies  ?Allergen Reactions  ? Estrogens Other (See Comments)  ?  VTE  ? Doxycycline Dermatitis, Photosensitivity and Rash  ? Eliquis [Apixaban] Other (See Comments)  ?  Myalgia, weight gain  ? Vicodin [Hydrocodone-Acetaminophen] Nausea And Vomiting  ? ? ?Past Medical History:  ?Diagnosis Date  ? Acute deep vein thrombosis (DVT) of femoral vein of left lower extremity ([place]) 08/04/2017  ? DVT 2014 -  Idiopathic etiology this was several years ago.   Patient took 3 months of anticoagulation medications.  ? Bullous pemphigoid   ? DVT (deep venous thrombosis) ([place]) 2015  ? Right leg  ? Endometrioma 2008  ? found by colonoscopy   ? Family history of cancer   ? Genetic testing 12/01/2017  ? Common Cancers panel (47 genes) @ [facility] - No pathogenic mutations detected  ? Pulmonary embolus ([place]) 08/2016  ? Uterine fibroid   ? ? ?Past Surgical History:  ?Procedure Laterality Date  ? COLONOSCOPY    ? FLEXIBLE SIGMOIDOSCOPY N/A 11/20/2017  ? Procedure: FLEXIBLE SIGMOIDOSCOPY;  Surgeon: Clarene Essex, MD;  Location: [facility];  Service: Endoscopy;  Laterality: N/A;  ? HOT HEMOSTASIS N/A 11/20/2017  ? Procedure: HOT HEMOSTASIS (ARGON PLASMA COAGULATION/BICAP);  Surgeon: Clarene Essex, MD;  Location: [facility];   Service: Endoscopy;  Laterality: N/A;  ? LAPAROSCOPIC SUPRACERVICAL HYSTERECTOMY  2007  ? Leiomyoma  ? ROTATOR CUFF REPAIR    ? THUMB ARTHROSCOPY    ? TUBAL LIGATION    ? ? ?Social History  ? ?Socioeconomic History  ? Marital status: Married  ?  Spouse name: Not on file  ? Number of children: Not on file  ? Years of education: Not on file  ? Highest education level: Not on file  ?Occupational History  ? Not on file  ?Tobacco Use  ? Smoking status: Never  ? Smokeless tobacco: Never  ?Vaping Use  ? Vaping Use: Never used  ?Substance and Sexual Activity  ? Alcohol use: Not Currently  ?  Alcohol/week: 7.0 standard drinks  ?  Types: 7 Glasses of wine per week  ? Drug use: No  ? Sexual activity: Yes  ?  Birth control/protection: Surgical  ?  Comment: 1st intercourse 61 yo-fewer than 5 partners-HYST  ?Other Topics Concern  ? Not on file  ?Social History Narrative  ? Not on file  ? ?Social Determinants of Health  ? ?Financial Resource Strain: Not on file  ?Food Insecurity: Not on file  ?Transportation Needs: Not on file  ?Physical Activity: Not on file  ?Stress: Not on file  ?Social Connections: Not on file  ? ? ?Family History  ?Problem Relation Age of Onset  ? COPD Mother   ? Prostate  cancer Father 78  ?     also throat ca; deceased 34  ? Hypertension Father   ? Heart disease Brother   ? Diabetes Brother   ? Stroke Brother   ? Breast cancer Maternal Aunt 80  ? Colon cancer Maternal Aunt 80  ?     currently 10  ? Breast cancer Cousin   ?     dx 96s; daughter of mat aunt with cancer  ? Breast cancer Cousin   ?     dx 57s; daughter of mat aunt with cancer  ? ? ?Health Maintenance  ?Topic Date Due  ? Zoster Vaccines- Shingrix (2 of 2) 03/25/2022 (Originally 10/29/2018)  ? MAMMOGRAM  12/08/2022 (Originally 12/22/2019)  ? HIV Screening  12/08/2022 (Originally 01/13/1978)  ? INFLUENZA VACCINE  02/22/2022  ? PAP SMEAR-Modifier  10/17/2022  ? COLONOSCOPY (Pts 45-18yr Insurance coverage will need to be confirmed)  10/03/2027  ?  TETANUS/TDAP  01/23/2028  ? COVID-19 Vaccine  Completed  ? Hepatitis C Screening  Completed  ? HPV VACCINES  Aged Out  ? ? ? ?----------------------------------------------------------------------------------------------------------------------------------------------------------------------------------------------------------------- ?Physical Exam ?BP 132/74 (BP Location: Left Arm, Patient Position: Sitting, Cuff Size: Small)   Pulse 77   Ht '5\' 1"'$  (1.549 m)   Wt 129 lb (58.5 kg)   SpO2 100%   BMI 24.37 kg/m?  ? ?Physical Exam ?Constitutional:   ?   Appearance: Normal appearance.  ?Cardiovascular:  ?   Rate and Rhythm: Normal rate and regular rhythm.  ?Musculoskeletal:  ?   Cervical back: Neck supple.  ?Neurological:  ?   General: No focal deficit present.  ?   Mental Status: She is alert.  ?Psychiatric:     ?   Mood and Affect: Mood normal.     ?   Behavior: Behavior normal.  ? ? ?------------------------------------------------------------------------------------------------------------------------------------------------------------------------------------------------------------------- ?Assessment and Plan ? ?Abnormal weight gain ?Continues to have difficulty keeping weight off.  We will phentermine back to 37.5 mg over the next few months.  Continue topiramate at current strength.  Continue regular exercise. ? ? ?Meds ordered this encounter  ?Medications  ? topiramate (TOPAMAX) 50 MG tablet  ?  Sig: TAKE 1 TABLET BY MOUTH TWICE DAILY  ?  Dispense:  60 tablet  ?  Refill:  3  ? phentermine 37.5 MG capsule  ?  Sig: Take 1 tab po daily  ?  Dispense:  90 capsule  ?  Refill:  0  ? ? ?Return in about 3 months (around 03/09/2022) for Weight mgmt. ? ? ? ?This visit occurred during the SARS-CoV-2 public health emergency.  Safety protocols were in place, including screening questions prior to the visit, additional usage of staff PPE, and extensive cleaning of exam room while observing appropriate contact time as  indicated for disinfecting solutions.  ? ?

## 2021-12-13 ENCOUNTER — Encounter (INDEPENDENT_AMBULATORY_CARE_PROVIDER_SITE_OTHER): Payer: 59 | Admitting: Family Medicine

## 2021-12-13 DIAGNOSIS — Z298 Encounter for other specified prophylactic measures: Secondary | ICD-10-CM

## 2021-12-13 MED ORDER — ATOVAQUONE-PROGUANIL HCL 250-100 MG PO TABS: ORAL_TABLET | ORAL | 0 refills | Status: DC

## 2021-12-13 NOTE — Telephone Encounter (Signed)
Please see the MyChart message reply(ies) for my assessment and plan.    This patient gave consent for this Medical Advice Message and is aware that it may result in a bill to their insurance company, as well as the possibility of receiving a bill for a co-payment or deductible. They are an established patient, but are not seeking medical advice exclusively about a problem treated during an in person or video visit in the last seven days. I did not recommend an in person or video visit within seven days of my reply.    I spent a total of 10 minutes cumulative time within 7 days through MyChart messaging.  Jonnae Fonseca, DO    

## 2022-02-07 ENCOUNTER — Other Ambulatory Visit: Payer: Self-pay | Admitting: Family Medicine

## 2022-02-07 DIAGNOSIS — Z7901 Long term (current) use of anticoagulants: Secondary | ICD-10-CM

## 2022-02-07 DIAGNOSIS — I2699 Other pulmonary embolism without acute cor pulmonale: Secondary | ICD-10-CM

## 2022-03-09 ENCOUNTER — Ambulatory Visit: Payer: 59 | Admitting: Family Medicine

## 2022-03-10 ENCOUNTER — Encounter: Payer: Self-pay | Admitting: Family Medicine

## 2022-03-10 ENCOUNTER — Ambulatory Visit: Payer: 59 | Admitting: Family Medicine

## 2022-03-10 DIAGNOSIS — I2699 Other pulmonary embolism without acute cor pulmonale: Secondary | ICD-10-CM

## 2022-03-10 DIAGNOSIS — R635 Abnormal weight gain: Secondary | ICD-10-CM | POA: Diagnosis not present

## 2022-03-10 DIAGNOSIS — Z7901 Long term (current) use of anticoagulants: Secondary | ICD-10-CM

## 2022-03-10 MED ORDER — RIVAROXABAN 10 MG PO TABS: 10.0000 mg | ORAL_TABLET | Freq: Every day | ORAL | 6 refills | Status: DC

## 2022-03-10 MED ORDER — PHENTERMINE HCL 37.5 MG PO CAPS: ORAL_CAPSULE | ORAL | 0 refills | Status: DC

## 2022-03-10 MED ORDER — TOPIRAMATE 50 MG PO TABS: ORAL_TABLET | ORAL | 3 refills | Status: DC

## 2022-03-10 MED ORDER — ATOVAQUONE-PROGUANIL HCL 250-100 MG PO TABS: ORAL_TABLET | ORAL | 0 refills | Status: DC

## 2022-03-10 NOTE — Progress Notes (Signed)
Julie Harding - 61 y.o. female MRN [id number]  Date of birth: 01/25/1961  Subjective No chief complaint on file.   HPI KELTIE LABELL is a 61 y.o. female here today for follow up visit.  She has had trouble maintaining weight.  Has used phentermine intermittently for several years to help with this.  This has worked well for her.  No issues with tolerance.  She continues to exercise vigorously each day.   She continues on xarelto due to history of recurrent DVT/PE.  Denies bleeding.    ROS:  A comprehensive ROS was completed and negative except as noted per HPI  Allergies  Allergen Reactions   Estrogens Other (See Comments)    VTE   Doxycycline Dermatitis, Photosensitivity and Rash   Eliquis [Apixaban] Other (See Comments)    Myalgia, weight gain   Vicodin [Hydrocodone-Acetaminophen] Nausea And Vomiting    Past Medical History:  Diagnosis Date   Acute deep vein thrombosis (DVT) of femoral vein of left lower extremity ([place]) 08/04/2017   DVT 2014 -  Idiopathic etiology this was several years ago.   Patient took 3 months of anticoagulation medications.   Bullous pemphigoid    DVT (deep venous thrombosis) ([place]) 2015   Right leg   Endometrioma 2008   found by colonoscopy    Family history of cancer    Genetic testing 12/01/2017   Common Cancers panel (47 genes) @ [facility] - No pathogenic mutations detected   Pulmonary embolus ([place]) 08/2016   Uterine fibroid     Past Surgical History:  Procedure Laterality Date   COLONOSCOPY     FLEXIBLE SIGMOIDOSCOPY N/A 11/20/2017   Procedure: FLEXIBLE SIGMOIDOSCOPY;  Surgeon: Clarene Essex, MD;  Location: [facility];  Service: Endoscopy;  Laterality: N/A;   HOT HEMOSTASIS N/A 11/20/2017   Procedure: HOT HEMOSTASIS (ARGON PLASMA COAGULATION/BICAP);  Surgeon: Clarene Essex, MD;  Location: [facility];  Service: Endoscopy;  Laterality: N/A;   LAPAROSCOPIC SUPRACERVICAL HYSTERECTOMY  2007   Leiomyoma   ROTATOR CUFF REPAIR     THUMB  ARTHROSCOPY     TUBAL LIGATION      Social History   Socioeconomic History   Marital status: Married    Spouse name: Not on file   Number of children: Not on file   Years of education: Not on file   Highest education level: Not on file  Occupational History   Not on file  Tobacco Use   Smoking status: Never   Smokeless tobacco: Never  Vaping Use   Vaping Use: Never used  Substance and Sexual Activity   Alcohol use: Not Currently    Alcohol/week: 7.0 standard drinks of alcohol    Types: 7 Glasses of wine per week   Drug use: No   Sexual activity: Yes    Birth control/protection: Surgical    Comment: 1st intercourse 61 yo-fewer than 5 partners-HYST  Other Topics Concern   Not on file  Social History Narrative   Not on file   Social Determinants of Health   Financial Resource Strain: Not on file  Food Insecurity: Not on file  Transportation Needs: Not on file  Physical Activity: Not on file  Stress: Not on file  Social Connections: Not on file    Family History  Problem Relation Age of Onset   COPD Mother    Prostate cancer Father 69       also throat ca; deceased 51   Hypertension Father    Heart disease Brother  Diabetes Brother    Stroke Brother    Breast cancer Maternal Aunt 80   Colon cancer Maternal Aunt 80       currently 63   Breast cancer Cousin        dx 69s; daughter of mat aunt with cancer   Breast cancer Cousin        dx 50s; daughter of mat aunt with cancer    Health Maintenance  Topic Date Due   Zoster Vaccines- Shingrix (2 of 2) 03/25/2022 (Originally 10/29/2018)   INFLUENZA VACCINE  10/23/2022 (Originally 02/22/2022)   MAMMOGRAM  12/08/2022 (Originally 12/22/2019)   HIV Screening  12/08/2022 (Originally 01/13/1978)   PAP SMEAR-Modifier  10/17/2022   COLONOSCOPY (Pts 45-14yr Insurance coverage will need to be confirmed)  10/03/2027   TETANUS/TDAP  01/23/2028   COVID-19 Vaccine  Completed   Hepatitis C Screening  Completed   HPV VACCINES   Aged Out     ----------------------------------------------------------------------------------------------------------------------------------------------------------------------------------------------------------------- Physical Exam BP 119/79   Pulse 90   Ht '5\' 1"'$  (1.549 m)   Wt 117 lb (53.1 kg)   SpO2 100%   BMI 22.11 kg/m   Physical Exam Constitutional:      Appearance: Normal appearance.  Cardiovascular:     Rate and Rhythm: Normal rate and regular rhythm.  Pulmonary:     Effort: Pulmonary effort is normal.     Breath sounds: Normal breath sounds.  Musculoskeletal:     Cervical back: Neck supple.  Neurological:     Mental Status: She is alert.  Psychiatric:        Mood and Affect: Mood normal.        Behavior: Behavior normal.     ------------------------------------------------------------------------------------------------------------------------------------------------------------------------------------------------------------------- Assessment and Plan  Abnormal weight gain She feels better at current weight.  [place]  Will not be exercising as much.  Will plan to continue for an additional month then discontinue. Return in about 5 months (around 08/10/2022) for Weight mgmt. .   Chronic anticoagulation Chronic Xarelto use due to recurrent DVT.  She is doing well with this, rx renewed.     Meds ordered this encounter  Medications   DISCONTD: phentermine 37.5 MG capsule    Sig: TAKE 1 CAPSULE BY MOUTH DAILY    Dispense:  30 capsule    Refill:  0   atovaquone-proguanil (MALARONE) 250-100 MG TABS tablet    Sig: Take 2 days prior to travel and continue daily throughout trip.   Take for additional week upon return.    Dispense:  30 tablet    Refill:  0   topiramate (TOPAMAX) 50 MG tablet    Sig: TAKE 1 TABLET BY MOUTH TWICE DAILY    Dispense:  60 tablet    Refill:  3   rivaroxaban (XARELTO) 10 MG TABS  tablet    Sig: Take 1 tablet (10 mg total) by mouth daily.    Dispense:  30 tablet    Refill:  6   phentermine 37.5 MG capsule    Sig: TAKE 1 CAPSULE BY MOUTH DAILY    Dispense:  30 capsule    Refill:  0    Return in about 5 months (around 08/10/2022) for Weight mgmt. .    This visit occurred during the SARS-CoV-2 public health emergency.  Safety protocols were in place, including screening questions prior to the visit, additional usage of staff PPE, and extensive cleaning of exam room while observing appropriate contact  time as indicated for disinfecting solutions.  f

## 2022-03-10 NOTE — Assessment & Plan Note (Signed)
She feels better at current weight.  Will be leaving the country for a trip to [place].  Will not be exercising as much.  Will plan to continue for an additional month then discontinue. Return in about 5 months (around 08/10/2022) for Weight mgmt. Julie Harding

## 2022-03-10 NOTE — Assessment & Plan Note (Signed)
Chronic Xarelto use due to recurrent DVT.  She is doing well with this, rx renewed.

## 2022-06-05 ENCOUNTER — Encounter: Payer: Self-pay | Admitting: Family Medicine

## 2022-06-05 DIAGNOSIS — R21 Rash and other nonspecific skin eruption: Secondary | ICD-10-CM

## 2022-06-08 NOTE — Telephone Encounter (Signed)
Please have her come in to the office for the labs as soon as she can.    CM

## 2022-06-14 LAB — PORPHYRINS, FRACTIONATION-PLASMA
Coproporphyrin.: 0.6 mcg/L
Total porphyrins: 0.6 mcg/L — ABNORMAL LOW (ref 1.0–5.6)

## 2022-06-20 LAB — PORPHYRINS, FRACTIONATED URINE (TIMED COLLECTION)
Coproporphyrin I: 15.4 mcg/24 h (ref 7.1–48.7)
Coproporphyrin III: 11.1 mcg/24 h (ref 11.0–148.5)
Porphyrins total: 43.3 mcg/24 h (ref 35.0–210.7)
Total Volume: 1650 mL
Uroporphyrin III (PORPF): 3.1 mcg/24 h (ref 0.7–7.4)
Uroporphyrins: 13.7 mcg/24 h (ref 4.1–22.4)

## 2022-06-21 ENCOUNTER — Encounter: Payer: Self-pay | Admitting: Family Medicine

## 2022-07-11 ENCOUNTER — Encounter: Payer: Self-pay | Admitting: Family Medicine

## 2022-08-31 ENCOUNTER — Other Ambulatory Visit: Payer: Self-pay | Admitting: Family Medicine

## 2022-08-31 DIAGNOSIS — I2699 Other pulmonary embolism without acute cor pulmonale: Secondary | ICD-10-CM

## 2022-08-31 DIAGNOSIS — Z7901 Long term (current) use of anticoagulants: Secondary | ICD-10-CM

## 2022-08-31 NOTE — Telephone Encounter (Signed)
Patient scheduled for 09/19/22 @ 1:30 tvt

## 2022-08-31 NOTE — Telephone Encounter (Signed)
Pls contact the patient to schedule appt. Past due 08/10/21. Thanks

## 2022-09-19 ENCOUNTER — Encounter: Payer: Self-pay | Admitting: Family Medicine

## 2022-09-19 ENCOUNTER — Ambulatory Visit: Payer: 59 | Admitting: Family Medicine

## 2022-09-19 VITALS — BP 136/79 | HR 88 | Ht 61.0 in | Wt 136.0 lb

## 2022-09-19 DIAGNOSIS — M79672 Pain in left foot: Secondary | ICD-10-CM

## 2022-09-19 DIAGNOSIS — R5383 Other fatigue: Secondary | ICD-10-CM

## 2022-09-19 DIAGNOSIS — M21612 Bunion of left foot: Secondary | ICD-10-CM | POA: Diagnosis not present

## 2022-09-19 DIAGNOSIS — M205X2 Other deformities of toe(s) (acquired), left foot: Secondary | ICD-10-CM | POA: Diagnosis not present

## 2022-09-19 DIAGNOSIS — M153 Secondary multiple arthritis: Secondary | ICD-10-CM

## 2022-09-19 DIAGNOSIS — N3941 Urge incontinence: Secondary | ICD-10-CM

## 2022-09-19 DIAGNOSIS — Z23 Encounter for immunization: Secondary | ICD-10-CM | POA: Diagnosis not present

## 2022-09-19 DIAGNOSIS — R635 Abnormal weight gain: Secondary | ICD-10-CM

## 2022-09-19 DIAGNOSIS — N39490 Overflow incontinence: Secondary | ICD-10-CM

## 2022-09-19 DIAGNOSIS — M2550 Pain in unspecified joint: Secondary | ICD-10-CM

## 2022-09-19 DIAGNOSIS — Z7901 Long term (current) use of anticoagulants: Secondary | ICD-10-CM

## 2022-09-19 MED ORDER — PHENTERMINE HCL 37.5 MG PO CAPS: ORAL_CAPSULE | ORAL | 2 refills | Status: DC

## 2022-09-19 MED ORDER — TOPIRAMATE 50 MG PO TABS: ORAL_TABLET | ORAL | 3 refills | Status: DC

## 2022-09-19 NOTE — Assessment & Plan Note (Signed)
Continues to have worsening arthralgia.  Check inflammatory markers as well as ANA and rheumatoid factor.  She may continue Tylenol as needed.

## 2022-09-19 NOTE — Assessment & Plan Note (Signed)
She has historically done well with combination of phentermine and topiramate.  Tolerating well.  Will add this back on return phase.  Continue to follow exercise and diet regimen.

## 2022-09-19 NOTE — Assessment & Plan Note (Signed)
Combination of urge and overflow incontinence.  Does not want to try medication at this time.  Checking urinalysis.  Recommend following a bladder program make sure she empties her bladder only basis.

## 2022-09-19 NOTE — Assessment & Plan Note (Signed)
On Xarelto.  She will continue this at current strength.

## 2022-09-19 NOTE — Progress Notes (Signed)
Julie Harding - 62 y.o. female MRN [id number]  Date of birth: [birth year]  Subjective Chief Complaint  Patient presents with   Weight Loss   Foot Pain    HPI Julie Harding is a 62 y.o. female here today for follow up visit.   She has a few issues she would like to discuss today.  She is having problems with the toes on her left foot.  The third digit is straight across around to the fourth digit.  Causing some irritation along the side of the toe.  She does have some mild pain with this.  She is a quite active and is a frequent runner.  Her weight has increased some from last visit.  She does continue to exercise vigorously including rowing and running daily.  Feels like her diet is pretty healthy at this time.  She has done well with intermittent phentermine and topiramate to maintain her weight.  She is tolerating this well without side effects.  She has had increased joint pain.  Pain located in multiple joints.  She has noted some swelling has.  Will utilize NSAIDs due to Xarelto use.  She does not feel that Tylenol has been effective.  She has had some urinary incontinence.  Reports that she feels like she needs to go and cannot hold it very long before having to go.  She would not really be interested in trying medications to help with this.  She denies pain with urination.  ROS:  A comprehensive ROS was completed and negative except as noted per HPI  Allergies  Allergen Reactions   Estrogens Other (See Comments)    VTE   Doxycycline Dermatitis, Photosensitivity and Rash   Eliquis [Apixaban] Other (See Comments)    Myalgia, weight gain   Vicodin [Hydrocodone-Acetaminophen] Nausea And Vomiting    Past Medical History:  Diagnosis Date   Acute deep vein thrombosis (DVT) of femoral vein of left lower extremity ([place]) 08/04/2017   DVT 2014 -  Idiopathic etiology this was several years ago.   Patient took 3 months of anticoagulation medications.   Bullous pemphigoid    DVT  (deep venous thrombosis) ([place]) 2015   Right leg   Endometrioma 2008   found by colonoscopy    Family history of cancer    Genetic testing 12/01/2017   Common Cancers panel (47 genes) @ [facility] - No pathogenic mutations detected   Pulmonary embolus ([place]) 08/2016   Uterine fibroid     Past Surgical History:  Procedure Laterality Date   COLONOSCOPY     FLEXIBLE SIGMOIDOSCOPY N/A 11/20/2017   Procedure: FLEXIBLE SIGMOIDOSCOPY;  Surgeon: Clarene Essex, MD;  Location: [facility];  Service: Endoscopy;  Laterality: N/A;   HOT HEMOSTASIS N/A 11/20/2017   Procedure: HOT HEMOSTASIS (ARGON PLASMA COAGULATION/BICAP);  Surgeon: Clarene Essex, MD;  Location: [facility];  Service: Endoscopy;  Laterality: N/A;   LAPAROSCOPIC SUPRACERVICAL HYSTERECTOMY  2007   Leiomyoma   ROTATOR CUFF REPAIR     THUMB ARTHROSCOPY     TUBAL LIGATION      Social History   Socioeconomic History   Marital status: Married    Spouse name: Not on file   Number of children: Not on file   Years of education: Not on file   Highest education level: Not on file  Occupational History   Not on file  Tobacco Use   Smoking status: Never   Smokeless tobacco: Never  Vaping Use   Vaping Use: Never used  Substance and Sexual Activity   Alcohol use: Not Currently    Alcohol/week: 7.0 standard drinks of alcohol    Types: 7 Glasses of wine per week   Drug use: No   Sexual activity: Yes    Birth control/protection: Surgical    Comment: 1st intercourse 62 yo-fewer than 5 partners-HYST  Other Topics Concern   Not on file  Social History Narrative   Not on file   Social Determinants of Health   Financial Resource Strain: Not on file  Food Insecurity: Not on file  Transportation Needs: Not on file  Physical Activity: Not on file  Stress: Not on file  Social Connections: Not on file    Family History  Problem Relation Age of Onset   COPD Mother    Prostate cancer Father 8       also throat ca; deceased 63    Hypertension Father    Heart disease Brother    Diabetes Brother    Stroke Brother    Breast cancer Maternal Aunt 80   Colon cancer Maternal Aunt 80       currently 65   Breast cancer Cousin        dx 23s; daughter of mat aunt with cancer   Breast cancer Cousin        dx 52s; daughter of mat aunt with cancer    Health Maintenance  Topic Date Due   PAP SMEAR-Modifier  10/17/2022   INFLUENZA VACCINE  10/23/2022 (Originally 02/22/2022)   MAMMOGRAM  12/08/2022 (Originally 12/22/2019)   HIV Screening  12/08/2022 (Originally 01/13/1978)   Zoster Vaccines- Shingrix (2 of 2) 12/18/2023 (Originally 10/29/2018)   COLONOSCOPY (Pts 45-62yr Insurance coverage will need to be confirmed)  10/03/2027   DTaP/Tdap/Td (3 - Td or Tdap) 01/23/2028   COVID-19 Vaccine  Completed   Hepatitis C Screening  Completed   HPV VACCINES  Aged Out     ----------------------------------------------------------------------------------------------------------------------------------------------------------------------------------------------------------------- Physical Exam BP 136/79 (BP Location: Right Arm, Patient Position: Sitting, Cuff Size: Normal)   Pulse 88   Ht '5\' 1"'$  (1.549 m)   Wt 136 lb (61.7 kg)   SpO2 99%   BMI 25.70 kg/m   Physical Exam Constitutional:      Appearance: Normal appearance.  HENT:     Head: Normocephalic and atraumatic.  Eyes:     General: No scleral icterus. Cardiovascular:     Rate and Rhythm: Normal rate and regular rhythm.  Pulmonary:     Effort: Pulmonary effort is normal.     Breath sounds: Normal breath sounds.  Musculoskeletal:     Cervical back: Neck supple.  Neurological:     Mental Status: She is alert.  Psychiatric:        Mood and Affect: Mood normal.        Behavior: Behavior normal.      ------------------------------------------------------------------------------------------------------------------------------------------------------------------------------------------------------------------- Assessment and Plan  Abnormal weight gain She has historically done well with combination of phentermine and topiramate.  Tolerating well.  Will add this back on return phase.  Continue to follow exercise and diet regimen.  Chronic anticoagulation On Xarelto.  She will continue this at current strength.  Crossover toe, left Crossing over of third toe on the fourth toe noted.  Small amount of maceration and irritation between these digits.  She may continue soft cotton and consider adding absorbent foot powder.  She does have bunion as well.  Referral placed to podiatry.  Degenerative joint disease involving multiple joints Continues to have worsening arthralgia.  Check inflammatory markers as well as ANA and rheumatoid factor.  She may continue Tylenol as needed.  Urge incontinence Combination of urge and overflow incontinence.  Does not want to try medication at this time.  Checking urinalysis.  Recommend following a bladder program make sure she empties her bladder only basis.   Meds ordered this encounter  Medications   phentermine 37.5 MG capsule    Sig: TAKE 1 CAPSULE BY MOUTH DAILY    Dispense:  30 capsule    Refill:  2   topiramate (TOPAMAX) 50 MG tablet    Sig: TAKE 1 TABLET BY MOUTH TWICE DAILY    Dispense:  60 tablet    Refill:  3    No follow-ups on file.    This visit occurred during the SARS-CoV-2 public health emergency.  Safety protocols were in place, including screening questions prior to the visit, additional usage of staff PPE, and extensive cleaning of exam room while observing appropriate contact time as indicated for disinfecting solutions.

## 2022-09-19 NOTE — Assessment & Plan Note (Signed)
Crossing over of third toe on the fourth toe noted.  Small amount of maceration and irritation between these digits.  She may continue soft cotton and consider adding absorbent foot powder.  She does have bunion as well.  Referral placed to podiatry.

## 2022-09-21 LAB — TSH: TSH: 1.93 mIU/L (ref 0.40–4.50)

## 2022-09-21 LAB — URINALYSIS W MICROSCOPIC + REFLEX CULTURE
Bacteria, UA: NONE SEEN /HPF
Bilirubin Urine: NEGATIVE
Glucose, UA: NEGATIVE
Hgb urine dipstick: NEGATIVE
Hyaline Cast: NONE SEEN /LPF
Ketones, ur: NEGATIVE
Leukocyte Esterase: NEGATIVE
Nitrites, Initial: NEGATIVE
Protein, ur: NEGATIVE
RBC / HPF: NONE SEEN /HPF (ref 0–2)
Specific Gravity, Urine: 1.024 (ref 1.001–1.035)
Squamous Epithelial / HPF: NONE SEEN /HPF (ref <=5)
WBC, UA: NONE SEEN /HPF (ref 0–5)
pH: 6.5 (ref 5.0–8.0)

## 2022-09-21 LAB — CBC WITH DIFFERENTIAL/PLATELET
Absolute Monocytes: 619 cells/uL (ref 200–950)
Basophils Absolute: 77 cells/uL (ref 0–200)
Basophils Relative: 0.9 %
Eosinophils Absolute: 172 cells/uL (ref 15–500)
Eosinophils Relative: 2 %
HCT: 44.5 % (ref 35.0–45.0)
Hemoglobin: 15.4 g/dL (ref 11.7–15.5)
Lymphs Abs: 2288 cells/uL (ref 850–3900)
MCH: 32 pg (ref 27.0–33.0)
MCHC: 34.6 g/dL (ref 32.0–36.0)
MCV: 92.5 fL (ref 80.0–100.0)
MPV: 9.3 fL (ref 7.5–12.5)
Monocytes Relative: 7.2 %
Neutro Abs: 5444 cells/uL (ref 1500–7800)
Neutrophils Relative %: 63.3 %
Platelets: 296 10*3/uL (ref 140–400)
RBC: 4.81 10*6/uL (ref 3.80–5.10)
RDW: 11.5 % (ref 11.0–15.0)
Total Lymphocyte: 26.6 %
WBC: 8.6 10*3/uL (ref 3.8–10.8)

## 2022-09-21 LAB — COMPLETE METABOLIC PANEL WITH GFR
AG Ratio: 1.7 (calc) (ref 1.0–2.5)
ALT: 18 U/L (ref 6–29)
AST: 21 U/L (ref 10–35)
Albumin: 4.5 g/dL (ref 3.6–5.1)
Alkaline phosphatase (APISO): 67 U/L (ref 37–153)
BUN: 23 mg/dL (ref 7–25)
CO2: 30 mmol/L (ref 20–32)
Calcium: 10 mg/dL (ref 8.6–10.4)
Chloride: 105 mmol/L (ref 98–110)
Creat: 0.75 mg/dL (ref 0.50–1.03)
Globulin: 2.7 g/dL (calc) (ref 1.9–3.7)
Glucose, Bld: 93 mg/dL (ref 65–99)
Potassium: 4.4 mmol/L (ref 3.5–5.3)
Sodium: 142 mmol/L (ref 135–146)
Total Bilirubin: 0.9 mg/dL (ref 0.2–1.2)
Total Protein: 7.2 g/dL (ref 6.1–8.1)
eGFR: 92 mL/min/{1.73_m2} (ref >=60)

## 2022-09-21 LAB — C-REACTIVE PROTEIN: CRP: 0.8 mg/L (ref <8.0)

## 2022-09-21 LAB — SEDIMENTATION RATE: Sed Rate: 2 mm/h (ref 0–30)

## 2022-09-21 LAB — NO CULTURE INDICATED

## 2022-09-21 LAB — ANA,IFA RA DIAG PNL W/RFLX TIT/PATN
Anti Nuclear Antibody (ANA): NEGATIVE
Cyclic Citrullin Peptide Ab: <16 UNITS
Rheumatoid fact SerPl-aCnc: 15 IU/mL — ABNORMAL HIGH (ref <14)

## 2022-09-22 ENCOUNTER — Encounter: Payer: Self-pay | Admitting: Family Medicine

## 2022-09-28 ENCOUNTER — Other Ambulatory Visit: Payer: Self-pay | Admitting: Family Medicine

## 2022-09-28 DIAGNOSIS — I2699 Other pulmonary embolism without acute cor pulmonale: Secondary | ICD-10-CM

## 2022-09-28 DIAGNOSIS — Z7901 Long term (current) use of anticoagulants: Secondary | ICD-10-CM

## 2022-12-12 ENCOUNTER — Encounter: Payer: Self-pay | Admitting: Family Medicine

## 2022-12-12 ENCOUNTER — Other Ambulatory Visit: Payer: Self-pay | Admitting: Family Medicine

## 2022-12-26 ENCOUNTER — Other Ambulatory Visit: Payer: Self-pay | Admitting: Family Medicine

## 2022-12-26 DIAGNOSIS — I2699 Other pulmonary embolism without acute cor pulmonale: Secondary | ICD-10-CM

## 2022-12-26 DIAGNOSIS — Z7901 Long term (current) use of anticoagulants: Secondary | ICD-10-CM

## 2023-01-09 ENCOUNTER — Other Ambulatory Visit: Payer: Self-pay | Admitting: Family Medicine

## 2023-01-09 DIAGNOSIS — R635 Abnormal weight gain: Secondary | ICD-10-CM

## 2023-03-20 ENCOUNTER — Other Ambulatory Visit: Payer: Self-pay | Admitting: Family Medicine

## 2023-03-20 DIAGNOSIS — I2699 Other pulmonary embolism without acute cor pulmonale: Secondary | ICD-10-CM

## 2023-03-20 DIAGNOSIS — Z7901 Long term (current) use of anticoagulants: Secondary | ICD-10-CM

## 2023-04-03 ENCOUNTER — Encounter: Payer: Self-pay | Admitting: Family Medicine

## 2023-04-04 NOTE — Telephone Encounter (Signed)
Patient scheduled.

## 2023-04-05 ENCOUNTER — Encounter: Payer: Self-pay | Admitting: Family Medicine

## 2023-04-05 ENCOUNTER — Ambulatory Visit: Payer: 59 | Admitting: Family Medicine

## 2023-04-05 VITALS — BP 117/77 | HR 64 | Ht 61.0 in | Wt 124.0 lb

## 2023-04-05 DIAGNOSIS — R635 Abnormal weight gain: Secondary | ICD-10-CM

## 2023-04-05 DIAGNOSIS — L139 Bullous disorder, unspecified: Secondary | ICD-10-CM | POA: Insufficient documentation

## 2023-04-05 DIAGNOSIS — S60429A Blister (nonthermal) of unspecified finger, initial encounter: Secondary | ICD-10-CM | POA: Diagnosis not present

## 2023-04-05 MED ORDER — PREDNISONE 20 MG PO TABS: 20.0000 mg | ORAL_TABLET | Freq: Two times a day (BID) | ORAL | 0 refills | Status: DC

## 2023-04-05 MED ORDER — PHENTERMINE HCL 37.5 MG PO CAPS: 37.5000 mg | ORAL_CAPSULE | ORAL | 3 refills | Status: DC

## 2023-04-05 NOTE — Progress Notes (Signed)
Julie Harding - 62 y.o. female MRN [id number]  Date of birth: Feb 18, 1961  Subjective Chief Complaint  Patient presents with   Blister    HPI Julie Harding is a 62 y.o. female here today with complaint of recurrent blistering rash.  She has had a few episodes of blistering rash hands.  Initially started as a small blister on and then turned into a bullous lesion.  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  Plasma and urine porphyrins were negative.  She reports that lesions do initially started as itchy before becoming more bullous and painful.  She denies fever or chills.  Bullous lesion on middle finger did rupture yesterday.  She does note that her flares seem to occur at the same time every year typically in late summer.  ROS:  A comprehensive ROS was completed and negative except as noted per HPI  Allergies  Allergen Reactions   Estrogens Other (See Comments)    VTE   Doxycycline Dermatitis, Photosensitivity and Rash   Eliquis [Apixaban] Other (See Comments)    Myalgia, weight gain   Vicodin [Hydrocodone-Acetaminophen] Nausea And Vomiting    Past Medical History:  Diagnosis Date   Acute deep vein thrombosis (DVT) of femoral vein of left lower extremity (HCC) 08/04/2017   DVT 2014 -  Idiopathic etiology this was several years ago.   Patient took 3 months of anticoagulation medications.   Bullous pemphigoid    DVT (deep venous thrombosis) (HCC) 2015   Right leg   Endometrioma 2008   found by colonoscopy    Family history of cancer    Genetic testing 12/01/2017   Common Cancers panel (47 genes) @ [facility] - No pathogenic mutations detected   Pulmonary embolus (HCC) 08/2016   Uterine fibroid     Past Surgical History:  Procedure Laterality Date   COLONOSCOPY     FLEXIBLE SIGMOIDOSCOPY N/A 11/20/2017   Procedure: FLEXIBLE SIGMOIDOSCOPY;  Surgeon: Vida Rigger, MD;  Location: [facility];  Service:  Endoscopy;  Laterality: N/A;   HOT HEMOSTASIS N/A 11/20/2017   Procedure: HOT HEMOSTASIS (ARGON PLASMA COAGULATION/BICAP);  Surgeon: Vida Rigger, MD;  Location: [facility];  Service: Endoscopy;  Laterality: N/A;   LAPAROSCOPIC SUPRACERVICAL HYSTERECTOMY  2007   Leiomyoma   ROTATOR CUFF REPAIR     THUMB ARTHROSCOPY     TUBAL LIGATION      Social History   Socioeconomic History   Marital status: Married    Spouse name: Not on file   Number of children: Not on file   Years of education: Not on file   Highest education level: Not on file  Occupational History   Not on file  Tobacco Use   Smoking status: Never   Smokeless tobacco: Never  Vaping Use   Vaping status: Never Used  Substance and Sexual Activity   Alcohol use: Not Currently    Alcohol/week: 7.0 standard drinks of alcohol    Types: 7 Glasses of wine per week   Drug use: No   Sexual activity: Yes    Birth control/protection: Surgical    Comment: 1st intercourse 62 yo-fewer than 5 partners-HYST  Other Topics Concern   Not on file  Social History Narrative   Not on file   Social Determinants of Health   Financial Resource Strain: Not on file  Food Insecurity: Not on file  Transportation Needs: Not on file  Physical Activity: Not on  file  Stress: Not on file  Social Connections: Not on file    Family History  Problem Relation Age of Onset   COPD Mother    Prostate cancer Father 58       also throat ca; deceased 85   Hypertension Father    Heart disease Brother    Diabetes Brother    Stroke Brother    Breast cancer Maternal Aunt 61   Colon cancer Maternal Aunt 64       currently 58   Breast cancer Cousin        dx 97s; daughter of mat aunt with cancer   Breast cancer Cousin        dx 64s; daughter of mat aunt with cancer    Health Maintenance  Topic Date Due   HIV Screening  Never done   MAMMOGRAM  12/22/2019   PAP SMEAR-Modifier  10/17/2022   INFLUENZA VACCINE  Never done   Zoster Vaccines-  Shingrix (2 of 2) 12/18/2023 (Originally 10/29/2018)   Colonoscopy  10/03/2027   DTaP/Tdap/Td (3 - Td or Tdap) 01/23/2028   COVID-19 Vaccine  Completed   Hepatitis C Screening  Completed   HPV VACCINES  Aged Out     ----------------------------------------------------------------------------------------------------------------------------------------------------------------------------------------------------------------- Physical Exam BP 117/77 (BP Location: Left Arm, Patient Position: Sitting, Cuff Size: Normal)   Pulse 64   Ht 5\' 1"  (1.549 m)   Wt 124 lb (56.2 kg)   SpO2 100%   BMI 23.43 kg/m   Physical Exam Constitutional:      Appearance: Normal appearance.  HENT:     Head: Normocephalic and atraumatic.  Eyes:     General: No scleral icterus. Cardiovascular:     Rate and Rhythm: Normal rate and regular rhythm.  Pulmonary:     Effort: Pulmonary effort is normal.     Breath sounds: Normal breath sounds.  Musculoskeletal:     Cervical back: Neck supple.  Neurological:     Mental Status: She is alert.  Psychiatric:        Mood and Affect: Mood normal.        Behavior: Behavior normal.     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  Laboratory testing did not show elevated levels of porphyrins.  Question more of autoimmune or dyshidrotic process.  Referral placed to dermatology.  Will add course of steroids to see if this provides any improvement for her.  Swab sent for bacterial as well as HSV culture.  Abnormal weight gain Phentermine renewed.  She will continue to exercise vigorously.   Meds ordered this encounter  Medications   predniSONE (DELTASONE) 20 MG tablet    Sig: Take 1 tablet (20 mg total) by mouth 2 (two)  times daily with a meal.    Dispense:  10 tablet    Refill:  0   phentermine 37.5 MG capsule    Sig: Take 1 capsule (37.5 mg total) by mouth every morning.    Dispense:  30 capsule    Refill:  3    No follow-ups on file.    This visit occurred during the SARS-CoV-2 public health emergency.  Safety protocols were in place, including screening questions prior to the visit, additional usage of staff PPE, and extensive cleaning of exam room while observing appropriate contact time as indicated for disinfecting solutions.

## 2023-04-05 NOTE — Assessment & Plan Note (Signed)
Phentermine renewed.  She will continue to exercise vigorously.

## 2023-04-05 NOTE — Assessment & Plan Note (Signed)
Previous biopsy with possible porphyria.  Laboratory testing did not show elevated levels of porphyrins.  Question more of autoimmune or dyshidrotic process.  Referral placed to dermatology.  Will add course of steroids to see if this provides any improvement for her.  Swab sent for bacterial as well as HSV culture.

## 2023-04-07 LAB — HSV DNA BY PCR (REFERENCE LAB)
HSV 2 DNA: NEGATIVE
HSV-1 DNA: NEGATIVE

## 2023-04-10 LAB — AEROBIC CULTURE

## 2023-04-12 ENCOUNTER — Encounter: Payer: Self-pay | Admitting: Family Medicine

## 2023-04-12 ENCOUNTER — Other Ambulatory Visit: Payer: Self-pay | Admitting: Family Medicine

## 2023-04-12 MED ORDER — MUPIROCIN 2 % EX OINT: 1.0000 | TOPICAL_OINTMENT | Freq: Two times a day (BID) | CUTANEOUS | 0 refills | Status: DC

## 2023-04-23 ENCOUNTER — Other Ambulatory Visit: Payer: Self-pay | Admitting: Family Medicine

## 2023-04-23 DIAGNOSIS — R635 Abnormal weight gain: Secondary | ICD-10-CM

## 2023-04-30 ENCOUNTER — Encounter: Payer: Self-pay | Admitting: Family Medicine

## 2023-06-24 ENCOUNTER — Other Ambulatory Visit: Payer: Self-pay | Admitting: Family Medicine

## 2023-06-24 DIAGNOSIS — Z7901 Long term (current) use of anticoagulants: Secondary | ICD-10-CM

## 2023-06-24 DIAGNOSIS — I2699 Other pulmonary embolism without acute cor pulmonale: Secondary | ICD-10-CM

## 2023-09-04 ENCOUNTER — Other Ambulatory Visit: Payer: Self-pay | Admitting: Family Medicine

## 2023-09-04 DIAGNOSIS — R635 Abnormal weight gain: Secondary | ICD-10-CM

## 2023-09-20 ENCOUNTER — Encounter: Payer: Self-pay | Admitting: Family Medicine

## 2023-09-26 ENCOUNTER — Other Ambulatory Visit: Payer: Self-pay | Admitting: Family Medicine

## 2023-09-26 DIAGNOSIS — Z7901 Long term (current) use of anticoagulants: Secondary | ICD-10-CM

## 2023-09-26 DIAGNOSIS — I2699 Other pulmonary embolism without acute cor pulmonale: Secondary | ICD-10-CM

## 2023-09-27 NOTE — Telephone Encounter (Signed)
 Patient scheduled for 09/28/23, thanks.

## 2023-09-27 NOTE — Telephone Encounter (Signed)
 Pls contact pt to schedule appt with Dr. Ashley Royalty for medication refills Xarelto & phentermine. Pt last seen for non-acute on 09/19/2022. Thanks

## 2023-09-28 ENCOUNTER — Ambulatory Visit: Admitting: Family Medicine

## 2023-09-28 VITALS — BP 139/75 | HR 72 | Ht 61.0 in | Wt 131.0 lb

## 2023-09-28 DIAGNOSIS — Z7901 Long term (current) use of anticoagulants: Secondary | ICD-10-CM

## 2023-09-28 DIAGNOSIS — Z23 Encounter for immunization: Secondary | ICD-10-CM | POA: Diagnosis not present

## 2023-09-28 DIAGNOSIS — Z1231 Encounter for screening mammogram for malignant neoplasm of breast: Secondary | ICD-10-CM | POA: Diagnosis not present

## 2023-09-28 DIAGNOSIS — R635 Abnormal weight gain: Secondary | ICD-10-CM | POA: Diagnosis not present

## 2023-09-28 DIAGNOSIS — I2699 Other pulmonary embolism without acute cor pulmonale: Secondary | ICD-10-CM

## 2023-09-28 MED ORDER — RIVAROXABAN 10 MG PO TABS: 10.0000 mg | ORAL_TABLET | Freq: Every day | ORAL | 1 refills | Status: DC

## 2023-09-28 MED ORDER — PHENTERMINE HCL 37.5 MG PO CAPS: 37.5000 mg | ORAL_CAPSULE | ORAL | 3 refills | Status: DC

## 2023-09-28 NOTE — Progress Notes (Signed)
 Julie Harding - 63 y.o. female MRN [id number]  Date of birth: Feb 21, 1961  Subjective Chief Complaint  Patient presents with   Weight Loss   Medical Management of Chronic Issues    HPI Julie Harding is a 63 y.o. female here today for follow up visit.    She reports that she is doing pretty well.  She has history of recurrent DVT/PE and remains on lifelong anticoagulation.  She is tolearting Xarelto pretty well.   She remains concerned about her weight.  She has used phentermine to help with weight loss previously and did well with this.  She denies side effects with this.  She does exercise vigorously throughout the week.   ROS:  A comprehensive ROS was completed and negative except as noted per HPI  Allergies  Allergen Reactions   Estrogens Other (See Comments)    VTE   Doxycycline Dermatitis, Photosensitivity and Rash   Eliquis [Apixaban] Other (See Comments)    Myalgia, weight gain   Vicodin [Hydrocodone-Acetaminophen] Nausea And Vomiting    Past Medical History:  Diagnosis Date   Acute deep vein thrombosis (DVT) of femoral vein of left lower extremity (HCC) 08/04/2017   DVT 2014 -  Idiopathic etiology this was several years ago.   Patient took 3 months of anticoagulation medications.   Bullous pemphigoid    DVT (deep venous thrombosis) (HCC) 2015   Right leg   Endometrioma 2008   found by colonoscopy    Family history of cancer    Genetic testing 12/01/2017   Common Cancers panel (47 genes) @ [facility] - No pathogenic mutations detected   Pulmonary embolus (HCC) 08/2016   Uterine fibroid     Past Surgical History:  Procedure Laterality Date   COLONOSCOPY     FLEXIBLE SIGMOIDOSCOPY N/A 11/20/2017   Procedure: FLEXIBLE SIGMOIDOSCOPY;  Surgeon: Vida Rigger, MD;  Location: [facility];  Service: Endoscopy;  Laterality: N/A;   HOT HEMOSTASIS N/A 11/20/2017   Procedure: HOT HEMOSTASIS (ARGON PLASMA COAGULATION/BICAP);  Surgeon: Vida Rigger, MD;  Location: [facility];   Service: Endoscopy;  Laterality: N/A;   LAPAROSCOPIC SUPRACERVICAL HYSTERECTOMY  2007   Leiomyoma   ROTATOR CUFF REPAIR     THUMB ARTHROSCOPY     TUBAL LIGATION      Social History   Socioeconomic History   Marital status: Married    Spouse name: Not on file   Number of children: Not on file   Years of education: Not on file   Highest education level: Not on file  Occupational History   Not on file  Tobacco Use   Smoking status: Never   Smokeless tobacco: Never  Vaping Use   Vaping status: Never Used  Substance and Sexual Activity   Alcohol use: Not Currently    Alcohol/week: 7.0 standard drinks of alcohol    Types: 7 Glasses of wine per week   Drug use: No   Sexual activity: Yes    Birth control/protection: Surgical    Comment: 1st intercourse 63 yo-fewer than 5 partners-HYST  Other Topics Concern   Not on file  Social History Narrative   Not on file   Social Drivers of Health   Financial Resource Strain: Not on file  Food Insecurity: Not on file  Transportation Needs: Not on file  Physical Activity: Not on file  Stress: Not on file  Social Connections: Not on file    Family History  Problem Relation Age of Onset   COPD Mother  Prostate cancer Father 56       also throat ca; deceased 33   Hypertension Father    Heart disease Brother    Diabetes Brother    Stroke Brother    Breast cancer Maternal Aunt 54   Colon cancer Maternal Aunt 10       currently 76   Breast cancer Cousin        dx 65s; daughter of mat aunt with cancer   Breast cancer Cousin        dx 2s; daughter of mat aunt with cancer    Health Maintenance  Topic Date Due   HIV Screening  Never done   MAMMOGRAM  12/22/2019   INFLUENZA VACCINE  10/23/2023 (Originally 02/23/2023)   Colonoscopy  10/03/2027   DTaP/Tdap/Td (3 - Td or Tdap) 01/23/2028   COVID-19 Vaccine  Completed   Hepatitis C Screening  Completed   Zoster Vaccines- Shingrix  Completed   HPV VACCINES  Aged Out      ----------------------------------------------------------------------------------------------------------------------------------------------------------------------------------------------------------------- Physical Exam BP 139/75 (BP Location: Left Arm, Patient Position: Sitting, Cuff Size: Normal)   Pulse 72   Ht 5\' 1"  (1.549 m)   Wt 131 lb (59.4 kg)   SpO2 100%   BMI 24.75 kg/m   Physical Exam Constitutional:      Appearance: Normal appearance.  HENT:     Head: Normocephalic and atraumatic.  Eyes:     General: No scleral icterus. Cardiovascular:     Rate and Rhythm: Normal rate and regular rhythm.  Pulmonary:     Effort: Pulmonary effort is normal.     Breath sounds: Normal breath sounds.  Musculoskeletal:     Cervical back: Neck supple.  Neurological:     Mental Status: She is alert.  Psychiatric:        Mood and Affect: Mood normal.        Behavior: Behavior normal.     ------------------------------------------------------------------------------------------------------------------------------------------------------------------------------------------------------------------- Assessment and Plan  Chronic anticoagulation On Xarelto for history of recurrent PE/DVT.Marland Kitchen  She will continue this at current strength.  Updating CBC and BMP.  Abnormal weight gain She has done well with phentermine.  Will add this back on for now.   Meds ordered this encounter  Medications   phentermine 37.5 MG capsule    Sig: Take 1 capsule (37.5 mg total) by mouth every morning.    Dispense:  30 capsule    Refill:  3   rivaroxaban (XARELTO) 10 MG TABS tablet    Sig: Take 1 tablet (10 mg total) by mouth daily.    Dispense:  90 tablet    Refill:  1    Return in about 6 months (around 03/30/2024) for weight management.    This visit occurred during the SARS-CoV-2 public health emergency.  Safety protocols were in place, including screening questions prior to the visit,  additional usage of staff PPE, and extensive cleaning of exam room while observing appropriate contact time as indicated for disinfecting solutions.

## 2023-10-01 ENCOUNTER — Encounter: Payer: Self-pay | Admitting: Family Medicine

## 2023-10-01 NOTE — Assessment & Plan Note (Signed)
 She has done well with phentermine.  Will add this back on for now.

## 2023-10-01 NOTE — Assessment & Plan Note (Addendum)
 On Xarelto for history of recurrent PE/DVT.Marland Kitchen  She will continue this at current strength.  Updating CBC and BMP.

## 2023-10-04 ENCOUNTER — Encounter: Payer: Self-pay | Admitting: Family Medicine

## 2023-10-04 LAB — CBC WITH DIFFERENTIAL/PLATELET
Basophils Absolute: 0.1 10*3/uL (ref 0.0–0.2)
Basos: 1 %
EOS (ABSOLUTE): 0.2 10*3/uL (ref 0.0–0.4)
Eos: 2 %
Hematocrit: 39.5 % (ref 34.0–46.6)
Hemoglobin: 13.3 g/dL (ref 11.1–15.9)
Immature Grans (Abs): 0 10*3/uL (ref 0.0–0.1)
Immature Granulocytes: 0 %
Lymphocytes Absolute: 2 10*3/uL (ref 0.7–3.1)
Lymphs: 30 %
MCH: 31.7 pg (ref 26.6–33.0)
MCHC: 33.7 g/dL (ref 31.5–35.7)
MCV: 94 fL (ref 79–97)
Monocytes Absolute: 0.4 10*3/uL (ref 0.1–0.9)
Monocytes: 6 %
Neutrophils Absolute: 4 10*3/uL (ref 1.4–7.0)
Neutrophils: 61 %
Platelets: 267 10*3/uL (ref 150–450)
RBC: 4.19 x10E6/uL (ref 3.77–5.28)
RDW: 11.8 % (ref 11.7–15.4)
WBC: 6.6 10*3/uL (ref 3.4–10.8)

## 2023-10-04 LAB — BASIC METABOLIC PANEL
BUN/Creatinine Ratio: 21 (ref 12–28)
BUN: 19 mg/dL (ref 8–27)
CO2: 19 mmol/L — ABNORMAL LOW (ref 20–29)
Calcium: 9.2 mg/dL (ref 8.7–10.3)
Chloride: 108 mmol/L — ABNORMAL HIGH (ref 96–106)
Creatinine, Ser: 0.92 mg/dL (ref 0.57–1.00)
Glucose: 85 mg/dL (ref 70–99)
Potassium: 4.1 mmol/L (ref 3.5–5.2)
Sodium: 142 mmol/L (ref 134–144)
eGFR: 71 mL/min/{1.73_m2} (ref >59)

## 2023-10-04 LAB — CORTISOL: Cortisol: 6.2 ug/dL (ref 6.2–19.4)

## 2023-12-23 ENCOUNTER — Other Ambulatory Visit: Payer: Self-pay | Admitting: Family Medicine

## 2023-12-23 DIAGNOSIS — R635 Abnormal weight gain: Secondary | ICD-10-CM

## 2024-01-22 ENCOUNTER — Other Ambulatory Visit: Payer: Self-pay | Admitting: Family Medicine

## 2024-03-22 ENCOUNTER — Other Ambulatory Visit: Payer: Self-pay | Admitting: Family Medicine

## 2024-03-22 DIAGNOSIS — Z7901 Long term (current) use of anticoagulants: Secondary | ICD-10-CM

## 2024-03-22 DIAGNOSIS — I2699 Other pulmonary embolism without acute cor pulmonale: Secondary | ICD-10-CM

## 2024-03-29 ENCOUNTER — Ambulatory Visit: Admitting: Family Medicine

## 2024-03-29 ENCOUNTER — Encounter: Payer: Self-pay | Admitting: Family Medicine

## 2024-03-29 VITALS — BP 132/76 | HR 69 | Ht 61.0 in | Wt 126.0 lb

## 2024-03-29 DIAGNOSIS — Z23 Encounter for immunization: Secondary | ICD-10-CM | POA: Diagnosis not present

## 2024-03-29 DIAGNOSIS — R635 Abnormal weight gain: Secondary | ICD-10-CM | POA: Diagnosis not present

## 2024-03-29 DIAGNOSIS — F439 Reaction to severe stress, unspecified: Secondary | ICD-10-CM | POA: Diagnosis not present

## 2024-03-29 DIAGNOSIS — I2699 Other pulmonary embolism without acute cor pulmonale: Secondary | ICD-10-CM

## 2024-03-29 DIAGNOSIS — Z7901 Long term (current) use of anticoagulants: Secondary | ICD-10-CM | POA: Diagnosis not present

## 2024-03-29 MED ORDER — RIVAROXABAN 10 MG PO TABS: 10.0000 mg | ORAL_TABLET | Freq: Every day | ORAL | 1 refills | Status: DC

## 2024-03-29 NOTE — Patient Instructions (Addendum)
 Try Magnesium glycinate 250-500mg  daily.  You can also try Valerian root.

## 2024-03-29 NOTE — Progress Notes (Signed)
 Julie Harding - 63 y.o. female MRN [id number]  Date of birth: 02-Jul-1961  Subjective Chief Complaint  Patient presents with   Weight Check   Immunizations    HPI Julie Harding is a 63 y.o. female here today for follow up visit.   She reports that she is doing ok.  She reports some increasd stress related to her jobs as well as her husbands health..  She continues to exercise vigorously and is following a healthy diet.  She has used phentermine  intermittently to help with weight management.  She has been off of this for about a month at this point.  She remains on chronic anticoagulation with xarelto  due to recurrent DVT/PE.   Tolerating this well at current strength.   ROS:  A comprehensive ROS was completed and negative except as noted per HPI  Allergies  Allergen Reactions   Estrogens Other (See Comments)    VTE   Doxycycline  Dermatitis, Photosensitivity and Rash   Eliquis [Apixaban] Other (See Comments)    Myalgia, weight gain   Vicodin [Hydrocodone-Acetaminophen] Nausea And Vomiting    Past Medical History:  Diagnosis Date   Acute deep vein thrombosis (DVT) of femoral vein of left lower extremity (HCC) 08/04/2017   DVT 2014 -  Idiopathic etiology this was several years ago.   Patient took 3 months of anticoagulation medications.   Bullous pemphigoid    DVT (deep venous thrombosis) (HCC) 2015   Right leg   Endometrioma 2008   found by colonoscopy    Family history of cancer    Genetic testing 12/01/2017   Common Cancers panel (47 genes) @ [facility] - No pathogenic mutations detected   Pulmonary embolus (HCC) 08/2016   Uterine fibroid     Past Surgical History:  Procedure Laterality Date   COLONOSCOPY     FLEXIBLE SIGMOIDOSCOPY N/A 11/20/2017   Procedure: FLEXIBLE SIGMOIDOSCOPY;  Surgeon: Rosalie Kitchens, MD;  Location: [facility];  Service: Endoscopy;  Laterality: N/A;   HOT HEMOSTASIS N/A 11/20/2017   Procedure: HOT HEMOSTASIS (ARGON PLASMA COAGULATION/BICAP);   Surgeon: Rosalie Kitchens, MD;  Location: [facility];  Service: Endoscopy;  Laterality: N/A;   LAPAROSCOPIC SUPRACERVICAL HYSTERECTOMY  2007   Leiomyoma   ROTATOR CUFF REPAIR     THUMB ARTHROSCOPY     TUBAL LIGATION      Social History   Socioeconomic History   Marital status: Married    Spouse name: Not on file   Number of children: Not on file   Years of education: Not on file   Highest education level: Not on file  Occupational History   Not on file  Tobacco Use   Smoking status: Never   Smokeless tobacco: Never  Vaping Use   Vaping status: Never Used  Substance and Sexual Activity   Alcohol use: Not Currently    Alcohol/week: 7.0 standard drinks of alcohol    Types: 7 Glasses of wine per week   Drug use: No   Sexual activity: Yes    Birth control/protection: Surgical    Comment: 1st intercourse 63 yo-fewer than 5 partners-HYST  Other Topics Concern   Not on file  Social History Narrative   Not on file   Social Drivers of Health   Financial Resource Strain: Not on file  Food Insecurity: Not on file  Transportation Needs: Not on file  Physical Activity: Not on file  Stress: Not on file  Social Connections: Not on file    Family History  Problem Relation  Age of Onset   COPD Mother    Prostate cancer Father 54       also throat ca; deceased 97   Hypertension Father    Heart disease Brother    Diabetes Brother    Stroke Brother    Breast cancer Maternal Aunt 45   Colon cancer Maternal Aunt 58       currently 72   Breast cancer Cousin        dx 68s; daughter of mat aunt with cancer   Breast cancer Cousin        dx 69s; daughter of mat aunt with cancer    Health Maintenance  Topic Date Due   HIV Screening  Never done   Hepatitis B Vaccines 19-59 Average Risk (3 of 3 - 19+ 3-dose series) 02/21/2019   MAMMOGRAM  12/22/2019   Influenza Vaccine  Never done   Colonoscopy  10/03/2027   DTaP/Tdap/Td (3 - Td or Tdap) 01/23/2028   Pneumococcal Vaccine: 50+  Years  Completed   COVID-19 Vaccine  Completed   Hepatitis C Screening  Completed   Zoster Vaccines- Shingrix   Completed   HPV VACCINES  Aged Out   Meningococcal B Vaccine  Aged Out     ----------------------------------------------------------------------------------------------------------------------------------------------------------------------------------------------------------------- Physical Exam BP 132/76   Pulse 69   Ht 5' 1 (1.549 m)   Wt 126 lb (57.2 kg)   SpO2 100%   BMI 23.81 kg/m   Physical Exam Constitutional:      Appearance: Normal appearance.  Eyes:     General: No scleral icterus. Cardiovascular:     Rate and Rhythm: Normal rate and regular rhythm.  Pulmonary:     Effort: Pulmonary effort is normal.     Breath sounds: Normal breath sounds.  Neurological:     Mental Status: She is alert.  Psychiatric:        Mood and Affect: Mood normal.        Behavior: Behavior normal.     ------------------------------------------------------------------------------------------------------------------------------------------------------------------------------------------------------------------- Assessment and Plan  Chronic anticoagulation On Xarelto  for history of recurrent PE/DVT.SABRA  She will continue this at current strength.   Stress She does feel like she is able to manage this pretty well.  She declines referral for counseling and/or addition of any medication at this time.  We discussed trial of magnesium at bedtime to help with any sleep issues she may be having.  Ashwagandha may also be helpful.  Abnormal weight gain She remains off of phentermine  for now.  We can revisit restarting this in a few months if she would like.   Meds ordered this encounter  Medications   DISCONTD: rivaroxaban  (XARELTO ) 10 MG TABS tablet    Sig: Take 1 tablet (10 mg total) by mouth daily.    Dispense:  150 tablet    Refill:  1   rivaroxaban  (XARELTO ) 10 MG TABS  tablet    Sig: Take 1 tablet (10 mg total) by mouth daily.    Dispense:  150 tablet    Refill:  1    No follow-ups on file.

## 2024-03-31 DIAGNOSIS — F439 Reaction to severe stress, unspecified: Secondary | ICD-10-CM | POA: Insufficient documentation

## 2024-03-31 NOTE — Assessment & Plan Note (Signed)
 She does feel like she is able to manage this pretty well.  She declines referral for counseling and/or addition of any medication at this time.  We discussed trial of magnesium at bedtime to help with any sleep issues she may be having.  Ashwagandha may also be helpful.

## 2024-03-31 NOTE — Assessment & Plan Note (Signed)
 On Xarelto  for history of recurrent PE/DVT.SABRA  She will continue this at current strength.

## 2024-03-31 NOTE — Assessment & Plan Note (Signed)
 She remains off of phentermine  for now.  We can revisit restarting this in a few months if she would like.

## 2024-04-15 ENCOUNTER — Ambulatory Visit
Admission: RE | Admit: 2024-04-15 | Discharge: 2024-04-15 | Disposition: A | Discharge status: Home / Self Care | Source: Ambulatory Visit | Attending: Family Medicine | Admitting: Family Medicine

## 2024-04-15 DIAGNOSIS — Z1231 Encounter for screening mammogram for malignant neoplasm of breast: Secondary | ICD-10-CM

## 2024-04-17 ENCOUNTER — Other Ambulatory Visit: Payer: Self-pay | Admitting: Family Medicine

## 2024-04-17 DIAGNOSIS — R635 Abnormal weight gain: Secondary | ICD-10-CM

## 2024-07-31 ENCOUNTER — Encounter: Payer: Self-pay | Admitting: Family Medicine

## 2024-08-02 MED ORDER — PHENTERMINE HCL 37.5 MG PO CAPS: 37.5000 mg | ORAL_CAPSULE | Freq: Every morning | ORAL | 1 refills | Status: AC

## 2024-08-19 ENCOUNTER — Other Ambulatory Visit: Payer: Self-pay | Admitting: Family Medicine

## 2024-08-19 DIAGNOSIS — R635 Abnormal weight gain: Secondary | ICD-10-CM

## 2024-08-19 NOTE — Telephone Encounter (Signed)
 Is the refill appropriate? Taking the rx for Abnormal weight gain.

## 2024-08-21 ENCOUNTER — Ambulatory Visit: Admitting: Family Medicine

## 2024-08-22 ENCOUNTER — Encounter: Payer: Self-pay | Admitting: Family Medicine

## 2024-08-22 ENCOUNTER — Ambulatory Visit: Admitting: Family Medicine

## 2024-08-22 VITALS — BP 128/82 | HR 87 | Ht 61.0 in | Wt 126.0 lb

## 2024-08-22 DIAGNOSIS — I2699 Other pulmonary embolism without acute cor pulmonale: Secondary | ICD-10-CM

## 2024-08-22 DIAGNOSIS — M21612 Bunion of left foot: Secondary | ICD-10-CM | POA: Diagnosis not present

## 2024-08-22 DIAGNOSIS — Z7901 Long term (current) use of anticoagulants: Secondary | ICD-10-CM

## 2024-08-22 DIAGNOSIS — R635 Abnormal weight gain: Secondary | ICD-10-CM | POA: Diagnosis not present

## 2024-08-22 MED ORDER — RIVAROXABAN 10 MG PO TABS: 10.0000 mg | ORAL_TABLET | Freq: Every day | ORAL | 1 refills | Status: AC

## 2024-08-22 NOTE — Assessment & Plan Note (Signed)
 Given information for Dr. Jadine at [facility] if she is interested in second opinion.

## 2024-08-22 NOTE — Progress Notes (Signed)
 " Julie Harding - 64 y.o. female MRN [id number]  Date of birth: 1960-12-14  Subjective Chief Complaint  Patient presents with   2nd Opinion    HPI Julie Harding is a 64 y.o. female here today for follow-up visit.  She reports she is doing okay.  Remains on phentermine  to help with weight management.  Doing well with this overall but does not feel quite as effective as it has in the past.  She does report history of ADHD and was on stimulant medication for this in the past.  Questioning whether something like Vyvanse may be a better option.  She does continue to exercise frequently.  She does indoor rowing daily.  Has not been able to run as much recently due to weather.  She remains on Xarelto  for history of recurrent DVT and PE.  Doing well with this.  No side effects at this time.  She is also having continued foot pain related to bunion on her left foot.  She has met with foot and ankle surgeon who recommended extensive surgery to repair this.  She is concerned this will sidelined her for several months in regards to her physical activity.  She would like second opinion.  ROS:  A comprehensive ROS was completed and negative except as noted per HPI  Allergies[1]  Past Medical History:  Diagnosis Date   Acute deep vein thrombosis (DVT) of femoral vein of left lower extremity (HCC) 08/04/2017   DVT 2014 -  Idiopathic etiology this was several years ago.   Patient took 3 months of anticoagulation medications.   Bullous pemphigoid    DVT (deep venous thrombosis) (HCC) 2015   Right leg   Endometrioma 2008   found by colonoscopy    Family history of cancer    Genetic testing 12/01/2017   Common Cancers panel (47 genes) @ [facility] - No pathogenic mutations detected   Pulmonary embolus (HCC) 08/2016   Uterine fibroid     Past Surgical History:  Procedure Laterality Date   COLONOSCOPY     FLEXIBLE SIGMOIDOSCOPY N/A 11/20/2017   Procedure: FLEXIBLE SIGMOIDOSCOPY;  Surgeon: Rosalie Kitchens, MD;  Location: [facility];  Service: Endoscopy;  Laterality: N/A;   HOT HEMOSTASIS N/A 11/20/2017   Procedure: HOT HEMOSTASIS (ARGON PLASMA COAGULATION/BICAP);  Surgeon: Rosalie Kitchens, MD;  Location: [facility];  Service: Endoscopy;  Laterality: N/A;   LAPAROSCOPIC SUPRACERVICAL HYSTERECTOMY  2007   Leiomyoma   ROTATOR CUFF REPAIR     THUMB ARTHROSCOPY     TUBAL LIGATION      Social History   Socioeconomic History   Marital status: Married    Spouse name: Not on file   Number of children: Not on file   Years of education: Not on file   Highest education level: Not on file  Occupational History   Not on file  Tobacco Use   Smoking status: Never   Smokeless tobacco: Never  Vaping Use   Vaping status: Never Used  Substance and Sexual Activity   Alcohol use: Not Currently    Alcohol/week: 7.0 standard drinks of alcohol    Types: 7 Glasses of wine per week   Drug use: No   Sexual activity: Yes    Birth control/protection: Surgical    Comment: 1st intercourse 64 yo-fewer than 5 partners-HYST  Other Topics Concern   Not on file  Social History Narrative   Not on file   Social Drivers of Health   Tobacco Use: Low Risk (  03/29/2024)   Patient History    Smoking Tobacco Use: Never    Smokeless Tobacco Use: Never    Passive Exposure: Not on file  Financial Resource Strain: Low Risk (08/22/2024)   Overall Financial Resource Strain (CARDIA)    Difficulty of Paying Living Expenses: Not hard at all  Food Insecurity: No Food Insecurity (08/22/2024)   Epic    Worried About Programme Researcher, Broadcasting/film/video in the Last Year: Never true    Ran Out of Food in the Last Year: Never true  Transportation Needs: No Transportation Needs (08/22/2024)   Epic    Lack of Transportation (Medical): No    Lack of Transportation (Non-Medical): No  Physical Activity: Sufficiently Active (08/22/2024)   Exercise Vital Sign    Days of Exercise per Week: 5 days    Minutes of Exercise per Session: 60 min   Stress: No Stress Concern Present (08/22/2024)   [employer] of Occupational Health - Occupational Stress Questionnaire    Feeling of Stress: Not at all  Social Connections: Moderately Integrated (08/22/2024)   Social Connection and Isolation Panel    Frequency of Communication with Friends and Family: More than three times a week    Frequency of Social Gatherings with Friends and Family: Once a week    Attends Religious Services: 1 to 4 times per year    Active Member of Clubs or Organizations: No    Attends Banker Meetings: Never    Marital Status: Married  Depression (PHQ2-9): Medium Risk (08/22/2024)   Depression (PHQ2-9)    PHQ-2 Score: 8  Alcohol Screen: Low Risk (08/22/2024)   Alcohol Screen    Last Alcohol Screening Score (AUDIT): 1  Housing: Low Risk (08/22/2024)   Epic    Unable to Pay for Housing in the Last Year: No    Number of Times Moved in the Last Year: 0    Homeless in the Last Year: No  Utilities: Not At Risk (08/22/2024)   Epic    Threatened with loss of utilities: No  Health Literacy: Adequate Health Literacy (08/22/2024)   B1300 Health Literacy    Frequency of need for help with medical instructions: Never    Family History  Problem Relation Age of Onset   COPD Mother    Prostate cancer Father 90       also throat ca; deceased 12   Hypertension Father    Heart disease Brother    Diabetes Brother    Stroke Brother    Breast cancer Maternal Aunt 7   Colon cancer Maternal Aunt 37       currently 24   Breast cancer Cousin        dx 66s; daughter of mat aunt with cancer   Breast cancer Cousin        dx 62s; daughter of mat aunt with cancer    Health Maintenance  Topic Date Due   HIV Screening  Never done   Hepatitis B Vaccines 19-59 Average Risk (3 of 3 - 19+ 3-dose series) 02/21/2019   Influenza Vaccine  10/22/2024 (Originally 02/23/2024)   COVID-19 Vaccine (8 - 2025-26 season) 03/24/2025 (Originally 03/25/2024)   Mammogram   04/15/2026   Colonoscopy  10/03/2027   DTaP/Tdap/Td (3 - Td or Tdap) 01/23/2028   Pneumococcal Vaccine: 50+ Years  Completed   HPV VACCINES (No Doses Required) Completed   Hepatitis C Screening  Completed   Zoster Vaccines- Shingrix   Completed   Meningococcal B Vaccine  Aged Out     -----------------------------------------------------------------------------------------------------------------------------------------------------------------------------------------------------------------  Physical Exam BP 128/82 (BP Location: Left Arm, Patient Position: Sitting, Cuff Size: Small)   Pulse 87   Ht 5' 1 (1.549 m)   Wt 126 lb (57.2 kg)   SpO2 98%   BMI 23.81 kg/m   Physical Exam Constitutional:      Appearance: Normal appearance.  HENT:     Head: Normocephalic and atraumatic.  Eyes:     General: No scleral icterus. Cardiovascular:     Rate and Rhythm: Normal rate and regular rhythm.  Pulmonary:     Effort: Pulmonary effort is normal.     Breath sounds: Normal breath sounds.  Musculoskeletal:     Cervical back: Neck supple.     Comments: Bunion on left foot.  Neurological:     Mental Status: She is alert.  Psychiatric:        Mood and Affect: Mood normal.        Behavior: Behavior normal.     ------------------------------------------------------------------------------------------------------------------------------------------------------------------------------------------------------------------- Assessment and Plan  Chronic anticoagulation On Xarelto  for history of recurrent PE/DVT.SABRA  She will continue this at current strength.   Abnormal weight gain Currently back on phentermine .  This is working okay but not as effective as it has been in the past.  Encouraged continued dietary change.  Bunion of great toe of left foot Given information for Dr. Jadine at [facility] if she is interested in second opinion.   Meds ordered this encounter  Medications    rivaroxaban  (XARELTO ) 10 MG TABS tablet    Sig: Take 1 tablet (10 mg total) by mouth daily.    Dispense:  180 tablet    Refill:  1    Return in about 4 months (around 12/20/2024) for weight management.        [1]  Allergies Allergen Reactions   Estrogens Other (See Comments)    VTE   Doxycycline  Dermatitis, Photosensitivity and Rash   Eliquis [Apixaban] Other (See Comments)    Myalgia, weight gain   Vicodin [Hydrocodone-Acetaminophen] Nausea And Vomiting   "

## 2024-08-22 NOTE — Assessment & Plan Note (Signed)
 On Xarelto  for history of recurrent PE/DVT.SABRA  She will continue this at current strength.

## 2024-08-22 NOTE — Assessment & Plan Note (Signed)
 Currently back on phentermine .  This is working okay but not as effective as it has been in the past.  Encouraged continued dietary change.

## 2024-12-20 ENCOUNTER — Ambulatory Visit: Admitting: Family Medicine
